# Patient Record
Sex: Male | Born: 1975 | Marital: Married | Smoking: Never smoker
Health system: Southern US, Community
[De-identification: ages and names within clinical notes are randomized; demographics above are authoritative.]

## PROBLEM LIST (undated history)

## (undated) DIAGNOSIS — K219 Gastro-esophageal reflux disease without esophagitis: Secondary | ICD-10-CM

## (undated) DIAGNOSIS — Z8719 Personal history of other diseases of the digestive system: Secondary | ICD-10-CM

## (undated) DIAGNOSIS — E785 Hyperlipidemia, unspecified: Secondary | ICD-10-CM

## (undated) DIAGNOSIS — T7840XA Allergy, unspecified, initial encounter: Secondary | ICD-10-CM

## (undated) DIAGNOSIS — F419 Anxiety disorder, unspecified: Secondary | ICD-10-CM

## (undated) HISTORY — DX: Anxiety disorder, unspecified: F41.9

## (undated) HISTORY — DX: Hyperlipidemia, unspecified: E78.5

## (undated) HISTORY — DX: Allergy, unspecified, initial encounter: T78.40XA

## (undated) HISTORY — PX: SEPTOPLASTY: SUR1290

---

## 2016-02-12 LAB — HM COLONOSCOPY

## 2016-08-19 HISTORY — PX: NASAL SEPTUM SURGERY: SHX37

## 2017-01-17 HISTORY — PX: ESOPHAGOGASTRODUODENOSCOPY: SHX1529

## 2017-01-17 HISTORY — PX: COLONOSCOPY: SHX174

## 2019-11-01 ENCOUNTER — Other Ambulatory Visit: Payer: Self-pay

## 2019-11-01 ENCOUNTER — Encounter: Payer: Self-pay | Admitting: Allergy and Immunology

## 2019-11-01 ENCOUNTER — Ambulatory Visit: Payer: BC Managed Care – PPO | Admitting: Allergy and Immunology

## 2019-11-01 VITALS — BP 118/80 | HR 60 | Temp 98.1°F | Resp 18 | Ht 73.0 in | Wt 196.1 lb

## 2019-11-01 DIAGNOSIS — H1013 Acute atopic conjunctivitis, bilateral: Secondary | ICD-10-CM

## 2019-11-01 DIAGNOSIS — H1010 Acute atopic conjunctivitis, unspecified eye: Secondary | ICD-10-CM | POA: Insufficient documentation

## 2019-11-01 DIAGNOSIS — J3089 Other allergic rhinitis: Secondary | ICD-10-CM | POA: Insufficient documentation

## 2019-11-01 MED ORDER — OLOPATADINE HCL 0.2 % OP SOLN: 1.0000 [drp] | Freq: Every day | OPHTHALMIC | 2 refills | Status: DC | PRN

## 2019-11-01 MED ORDER — LEVOCETIRIZINE DIHYDROCHLORIDE 5 MG PO TABS: 5.0000 mg | ORAL_TABLET | Freq: Every day | ORAL | 2 refills | Status: DC | PRN

## 2019-11-01 MED ORDER — XHANCE 93 MCG/ACT NA EXHU: 2.0000 | INHALANT_SUSPENSION | Freq: Two times a day (BID) | NASAL | 2 refills | Status: DC | PRN

## 2019-11-01 MED ORDER — EPINEPHRINE 0.3 MG/0.3ML IJ SOAJ: 0.3000 mg | Freq: Once | INTRAMUSCULAR | 1 refills | Status: AC

## 2019-11-01 NOTE — Assessment & Plan Note (Addendum)
   Aeroallergen avoidance measures have been discussed and provided in written form.  A prescription has been provided for levocetirizine(Xyzal), 5 mg daily as needed.  To avoid diminishing benefit with daily use (tachyphylaxis) of second generation antihistamine, consider alternating every few months between fexofenadine (Allegra) and levocetirizine (Xyzal).  A prescription has been provided for [facility], 2 actuations per nostril twice a day as needed. Proper technique has been discussed and demonstrated.  Nasal saline spray (i.e., Simply Saline) or nasal saline lavage (i.e., NeilMed) is recommended as needed and prior to medicated nasal sprays.  The risks and benefits of aeroallergen immunotherapy have been discussed. The patient is motivated to initiate immunotherapy if insurance coverage is favorable. He will let us know how he would like to proceed.

## 2019-11-01 NOTE — Progress Notes (Signed)
New Patient Note  RE: Cameron Joyce MRN: [id number] DOB: Feb 19, 1976 Date of Office Visit: 11/01/2019  Referring provider: No ref. provider found Primary care provider: Medicine, [facility]  Chief Complaint: Allergic Rhinitis  and Conjunctivitis   History of present illness: Cameron Joyce is a 44 y.o. male presenting today for evaluation of allergic rhinoconjunctivitis.  He experiences nasal congestion, rhinorrhea, sneezing, postnasal drainage, nasal pruritus, and ocular pruritus.  He started immunotherapy injections at [facility] otolaryngologist office approximately 16 years ago.  He took the injections for 15 years and then over the past year has been using sublingual drops.  He has noticed some improvement of his allergy symptoms, however still requires fexofenadine in the spring and summer for nasal/ocular symptoms.  He moved from the [address] to [address] in June 2020.  He has no history of symptoms consistent with asthma, eczema, or food allergies.  Assessment and plan: Seasonal and perennial allergic rhinitis  Aeroallergen avoidance measures have been discussed and provided in written form.  A prescription has been provided for levocetirizine(Xyzal), 5 mg daily as needed.  To avoid diminishing benefit with daily use (tachyphylaxis) of second generation antihistamine, consider alternating every few months between fexofenadine (Allegra) and levocetirizine (Xyzal).  A prescription has been provided for [facility], 2 actuations per nostril twice a day as needed. Proper technique has been discussed and demonstrated.  Nasal saline spray (i.e., Simply Saline) or nasal saline lavage (i.e., NeilMed) is recommended as needed and prior to medicated nasal sprays.  The risks and benefits of aeroallergen immunotherapy have been discussed. The patient is motivated to initiate immunotherapy if insurance coverage is favorable. He will let us know how he  would like to proceed.  Allergic conjunctivitis  Treatment plan as outlined above for allergic rhinitis.  A prescription has been provided for Pataday, one drop per eye daily as needed.  I have also recommended eye lubricant drops (i.e., Natural Tears) as needed.   Meds ordered this encounter  Medications  . levocetirizine (XYZAL) 5 MG tablet    Sig: Take 1 tablet (5 mg total) by mouth daily as needed for allergies.    Dispense:  30 tablet    Refill:  2  . Fluticasone Propionate (XHANCE) 93 MCG/ACT EXHU    Sig: Place 2 sprays into the nose 2 (two) times daily as needed.    Dispense:  16 mL    Refill:  2    Please call [phone] for delivery.  . Olopatadine HCl (PATADAY) 0.2 % SOLN    Sig: Place 1 drop into both eyes daily as needed.    Dispense:  2.5 mL    Refill:  2  . EPINEPHrine (AUVI-Q) 0.3 mg/0.3 mL IJ SOAJ injection    Sig: Inject 0.3 mLs (0.3 mg total) into the muscle once for 1 dose. As directed for life-threatening allergic reactions    Dispense:  2 each    Refill:  1    [phone]    Diagnostics: Epicutaneous testing: Robust reactivity to grass pollen, ragweed pollen, weed pollen, and tree pollen. Intradermal testing: Positive to molds, cat hair, and dust mite antigen.   Physical examination: Blood pressure 118/80, pulse 60, temperature 98.1 F (36.7 C), temperature source Temporal, resp. rate 18, height 6\' 1"  (1.854 m), weight 196 lb 1.6 oz (89 kg), SpO2 97 %.  General: Alert, interactive, in no acute distress. HEENT: TMs pearly gray, turbinates moderately edematous with clear discharge, post-pharynx erythematous. Neck: Supple  without lymphadenopathy. Lungs: Clear to auscultation without wheezing, rhonchi or rales. CV: Normal S1, S2 without murmurs. Abdomen: Nondistended, nontender. Skin: Warm and dry, without lesions or rashes. Extremities:  No clubbing, cyanosis or edema. Neuro:   Grossly intact.  Review of systems:  Review of systems negative  except as noted in HPI / PMHx or noted below: Review of Systems  Constitutional: Negative.   HENT: Negative.   Eyes: Negative.   Respiratory: Negative.   Cardiovascular: Negative.   Gastrointestinal: Negative.   Genitourinary: Negative.   Musculoskeletal: Negative.   Skin: Negative.   Neurological: Negative.   Endo/Heme/Allergies: Negative.   Psychiatric/Behavioral: Negative.     Past medical history:  Past Medical History:  Diagnosis Date  . Anxiety     Past surgical history:  Past Surgical History:  Procedure Laterality Date  . NASAL SEPTUM SURGERY  2018    Family history: Family History  Problem Relation Age of Onset  . Healthy Mother   . Diabetes Father   . Healthy Brother   . Arthritis Maternal Grandmother   . Lung cancer Paternal Grandmother   . Heart disease Paternal Grandfather   . Healthy Brother     Social history: Social History   Socioeconomic History  . Marital status: Married    Spouse name: Not on file  . Number of children: Not on file  . Years of education: Not on file  . Highest education level: Not on file  Occupational History  . Not on file  Tobacco Use  . Smoking status: Never Smoker  . Smokeless tobacco: Never Used  Substance and Sexual Activity  . Alcohol use: Yes    Comment: occasional   . Drug use: Never  . Sexual activity: Not on file  Other Topics Concern  . Not on file  Social History Narrative  . Not on file   Social Determinants of Health   Financial Resource Strain:   . Difficulty of Paying Living Expenses:   Food Insecurity:   . Worried About Programme researcher, broadcasting/film/video in the Last Year:   . Barista in the Last Year:   Transportation Needs:   . Freight forwarder (Medical):   Marland Kitchen Lack of Transportation (Non-Medical):   Physical Activity:   . Days of Exercise per Week:   . Minutes of Exercise per Session:   Stress:   . Feeling of Stress :   Social Connections:   . Frequency of Communication with Friends  and Family:   . Frequency of Social Gatherings with Friends and Family:   . Attends Religious Services:   . Active Member of Clubs or Organizations:   . Attends Banker Meetings:   Marland Kitchen Marital Status:   Intimate Partner Violence:   . Fear of Current or Ex-Partner:   . Emotionally Abused:   Marland Kitchen Physically Abused:   . Sexually Abused:     Environmental History: The patient lives in a 45 year old house with LVP throughout, gassy, and central air.  There is no known mold/water damage in the home.  There are no pets in the home.  He is a non-smoker.  Current Outpatient Medications  Medication Sig Dispense Refill  . atorvastatin (LIPITOR) 10 MG tablet Take 10 mg by mouth daily.    Marland Kitchen escitalopram (LEXAPRO) 20 MG tablet Take 20 mg by mouth daily.    . fexofenadine (ALLEGRA) 180 MG tablet Take 180 mg by mouth daily as needed for allergies or rhinitis.    Marland Kitchen  omeprazole (PRILOSEC) 20 MG capsule Take 20 mg by mouth every morning.    Marland Kitchen EPINEPHrine (AUVI-Q) 0.3 mg/0.3 mL IJ SOAJ injection Inject 0.3 mLs (0.3 mg total) into the muscle once for 1 dose. As directed for life-threatening allergic reactions 2 each 1  . Fluticasone Propionate (XHANCE) 93 MCG/ACT EXHU Place 2 sprays into the nose 2 (two) times daily as needed. 16 mL 2  . levocetirizine (XYZAL) 5 MG tablet Take 1 tablet (5 mg total) by mouth daily as needed for allergies. 30 tablet 2  . Olopatadine HCl (PATADAY) 0.2 % SOLN Place 1 drop into both eyes daily as needed. 2.5 mL 2   No current facility-administered medications for this visit.    Known medication allergies: No Known Allergies  I appreciate the opportunity to take part in Trevar's care. Please do not hesitate to contact me with questions.  Sincerely,   R. Edgar Frisk, MD

## 2019-11-01 NOTE — Patient Instructions (Addendum)
Seasonal and perennial allergic rhinitis  Aeroallergen avoidance measures have been discussed and provided in written form.  A prescription has been provided for levocetirizine(Xyzal), 5 mg daily as needed.  To avoid diminishing benefit with daily use (tachyphylaxis) of second generation antihistamine, consider alternating every few months between fexofenadine (Allegra) and levocetirizine (Xyzal).  A prescription has been provided for [facility], 2 actuations per nostril twice a day as needed. Proper technique has been discussed and demonstrated.  Nasal saline spray (i.e., Simply Saline) or nasal saline lavage (i.e., NeilMed) is recommended as needed and prior to medicated nasal sprays.  The risks and benefits of aeroallergen immunotherapy have been discussed. The patient is motivated to initiate immunotherapy if insurance coverage is favorable. He will let us know how he would like to proceed.  Allergic conjunctivitis  Treatment plan as outlined above for allergic rhinitis.  A prescription has been provided for Pataday, one drop per eye daily as needed.  I have also recommended eye lubricant drops (i.e., Natural Tears) as needed.   Return in about 3 months (around 02/01/2020), or if symptoms worsen or fail to improve.  Control of Dust Mite Allergen  House dust mites play a major role in allergic asthma and rhinitis.  They occur in environments with high humidity wherever human skin, the food for dust mites is found. High levels have been detected in dust obtained from mattresses, pillows, carpets, upholstered furniture, bed covers, clothes and soft toys.  The principal allergen of the house dust mite is found in its feces.  A gram of dust may contain 1,000 mites and 250,000 fecal particles.  Mite antigen is easily measured in the air during house cleaning activities.    1. Encase mattresses, including the box spring, and pillow, in an air tight cover.  Seal the zipper end of the encased  mattresses with wide adhesive tape. 2. Wash the bedding in water of 130 degrees Farenheit weekly.  Avoid cotton comforters/quilts and flannel bedding: the most ideal bed covering is the dacron comforter. 3. Remove all upholstered furniture from the bedroom. 4. Remove carpets, carpet padding, rugs, and non-washable window drapes from the bedroom.  Wash drapes weekly or use plastic window coverings. 5. Remove all non-washable stuffed toys from the bedroom.  Wash stuffed toys weekly. 6. Have the room cleaned frequently with a vacuum cleaner and a damp dust-mop.  The patient should not be in a room which is being cleaned and should wait 1 hour after cleaning before going into the room. 7. Close and seal all heating outlets in the bedroom.  Otherwise, the room will become filled with dust-laden air.  An electric heater can be used to heat the room. Reduce indoor humidity to less than 50%.  Do not use a humidifier.   Reducing Pollen Exposure  The American Academy of Allergy, Asthma and Immunology suggests the following steps to reduce your exposure to pollen during allergy seasons.    1. Do not hang sheets or clothing out to dry; pollen may collect on these items. 2. Do not mow lawns or spend time around freshly cut grass; mowing stirs up pollen. 3. Keep windows closed at night.  Keep car windows closed while driving. 4. Minimize morning activities outdoors, a time when pollen counts are usually at their highest. 5. Stay indoors as much as possible when pollen counts or humidity is high and on windy days when pollen tends to remain in the air longer. 6. Use air conditioning when possible.  Many air  conditioners have filters that trap the pollen spores. 7. Use a HEPA room air filter to remove pollen form the indoor air you breathe.   Control of Dog or Cat Allergen  Avoidance is the best way to manage a dog or cat allergy. If you have a dog or cat and are allergic to dog or cats, consider removing  the dog or cat from the home. If you have a dog or cat but don't want to find it a new home, or if your family wants a pet even though someone in the household is allergic, here are some strategies that may help keep symptoms at bay:  1. Keep the pet out of your bedroom and restrict it to only a few rooms. Be advised that keeping the dog or cat in only one room will not limit the allergens to that room. 2. Don't pet, hug or kiss the dog or cat; if you do, wash your hands with soap and water. 3. High-efficiency particulate air (HEPA) cleaners run continuously in a bedroom or living room can reduce allergen levels over time. 4. Place electrostatic material sheet in the air inlet vent in the bedroom. 5. Regular use of a high-efficiency vacuum cleaner or a central vacuum can reduce allergen levels. 6. Giving your dog or cat a bath at least once a week can reduce airborne allergen.   Control of Mold Allergen  Mold and fungi can grow on a variety of surfaces provided certain temperature and moisture conditions exist.  Outdoor molds grow on plants, decaying vegetation and soil.  The major outdoor mold, Alternaria and Cladosporium, are found in very high numbers during hot and dry conditions.  Generally, a late Summer - Fall peak is seen for common outdoor fungal spores.  Rain will temporarily lower outdoor mold spore count, but counts rise rapidly when the rainy period ends.  The most important indoor molds are Aspergillus and Penicillium.  Dark, humid and poorly ventilated basements are ideal sites for mold growth.  The next most common sites of mold growth are the bathroom and the kitchen.  Outdoor [employer] 1. Use air conditioning and keep windows closed 2. Avoid exposure to decaying vegetation. 3. Avoid leaf raking. 4. Avoid grain handling. 5. Consider wearing a face mask if working in moldy areas.  Indoor Mold Control 1. Maintain humidity below 50%. 2. Clean washable surfaces with 5% bleach  solution. 3. Remove sources e.g. Contaminated carpets.

## 2019-11-01 NOTE — Assessment & Plan Note (Signed)
   Treatment plan as outlined above for allergic rhinitis.  A prescription has been provided for Pataday, one drop per eye daily as needed.  I have also recommended eye lubricant drops (i.e., Natural Tears) as needed. 

## 2019-11-02 NOTE — Progress Notes (Signed)
VIALS EXP 11-01-20 

## 2019-11-03 DIAGNOSIS — J301 Allergic rhinitis due to pollen: Secondary | ICD-10-CM | POA: Diagnosis not present

## 2019-11-04 DIAGNOSIS — J3089 Other allergic rhinitis: Secondary | ICD-10-CM | POA: Diagnosis not present

## 2019-11-05 ENCOUNTER — Encounter: Payer: Self-pay | Admitting: Medical

## 2019-11-05 ENCOUNTER — Other Ambulatory Visit: Payer: Self-pay

## 2019-11-05 ENCOUNTER — Ambulatory Visit: Payer: BC Managed Care – PPO | Admitting: Medical

## 2019-11-05 VITALS — BP 132/80 | HR 61 | Temp 97.7°F | Ht 73.0 in | Wt 194.4 lb

## 2019-11-05 DIAGNOSIS — Z7185 Encounter for immunization safety counseling: Secondary | ICD-10-CM

## 2019-11-05 DIAGNOSIS — Z0000 Encounter for general adult medical examination without abnormal findings: Secondary | ICD-10-CM | POA: Diagnosis not present

## 2019-11-05 DIAGNOSIS — E785 Hyperlipidemia, unspecified: Secondary | ICD-10-CM | POA: Insufficient documentation

## 2019-11-05 DIAGNOSIS — Z7189 Other specified counseling: Secondary | ICD-10-CM

## 2019-11-05 DIAGNOSIS — J301 Allergic rhinitis due to pollen: Secondary | ICD-10-CM | POA: Diagnosis not present

## 2019-11-05 DIAGNOSIS — Z566 Other physical and mental strain related to work: Secondary | ICD-10-CM | POA: Insufficient documentation

## 2019-11-05 DIAGNOSIS — K219 Gastro-esophageal reflux disease without esophagitis: Secondary | ICD-10-CM | POA: Diagnosis not present

## 2019-11-05 DIAGNOSIS — K449 Diaphragmatic hernia without obstruction or gangrene: Secondary | ICD-10-CM | POA: Diagnosis not present

## 2019-11-05 MED ORDER — ESCITALOPRAM OXALATE 20 MG PO TABS: 20.0000 mg | ORAL_TABLET | Freq: Every day | ORAL | 0 refills | Status: DC

## 2019-11-05 MED ORDER — OMEPRAZOLE 20 MG PO CPDR: 20.0000 mg | DELAYED_RELEASE_CAPSULE | Freq: Every morning | ORAL | 3 refills | Status: DC

## 2019-11-05 MED ORDER — ALPRAZOLAM 0.5 MG PO TABS: 0.5000 mg | ORAL_TABLET | Freq: Every evening | ORAL | 0 refills | Status: DC | PRN

## 2019-11-05 NOTE — Progress Notes (Addendum)
Subjective:   HPI  Cameron Joyce is a 44 y.o. male who presents for Chief Complaint  Patient presents with  . New Patient (Initial Visit)    establish care   . Annual Exam    Patient Care Team: Jennet Scroggin, Cleda Mccreedy as PCP - General (Family Medicine) Sees dentist Sees eye doctor  Concerns: Referred by Lorayne Bender.  Was prior seeing PCP Scherry Ran in [place], [place].  Moved here in June.  Wife working on Ph D [employer]  Last Td 2011.    Lipids - On cholestenol medication 4 years.  Reportedly could be genetic related.  No other cardiac risk factors.    Put on Lexapro a few years ago for anger and outbursts related to stress.   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   Since moving to [place], much less stress at [employer] band program.  He now wonders if he should cut back to 10mg  daily.  Reviewed their medical, surgical, family, social, medication, and allergy history and updated chart as appropriate.  Past Medical History:  Diagnosis Date  . Allergy   . Anxiety   . Hyperlipidemia     Past Surgical History:  Procedure Laterality Date  . COLONOSCOPY  01/2017  . ESOPHAGOGASTRODUODENOSCOPY  01/2017   hiatal hernia  . NASAL SEPTUM SURGERY  2018  . SEPTOPLASTY     & turbinate reduction    Social History   Socioeconomic History  . Marital status: Married    Spouse name: Not on file  . Number of children: Not on file  . Years of education: Not on file  . Highest education level: Not on file  Occupational History  . Not on file  Tobacco Use  . Smoking status: Never Smoker  . Smokeless tobacco: Never Used  Substance and Sexual Activity  . Alcohol use: Yes    Comment: occasional   . Drug use: Never  . Sexual activity: Not on file  Other Topics Concern  . Not on file  Social History Narrative   Married, wife in PhD music program 2019, he teaches music and band at  [employer], exercise most days per week, [employer], has son in school of arts.   10/2019   Social Determinants of Health   Financial Resource Strain:   . Difficulty of Paying Living Expenses:   Food Insecurity:   . Worried About 11/2019 in the Last Year:   . Programme researcher, broadcasting/film/video in the Last Year:   Transportation Needs:   . Barista (Medical):   Freight forwarder Lack of Transportation (Non-Medical):   Physical Activity:   . Days of Exercise per Week:   . Minutes of Exercise per Session:   Stress:   . Feeling of Stress :   Social Connections:   . Frequency of Communication with Friends and Family:   . Frequency of Social Gatherings with Friends and Family:   . Attends Religious Services:   . Active Member of Clubs or Organizations:   . Attends Marland Kitchen Meetings:   Banker Marital Status:   Intimate Partner Violence:   . Fear of Current or Ex-Partner:   . Emotionally Abused:   Marland Kitchen Physically Abused:   . Sexually Abused:     Family History  Problem Relation Age of Onset  . Healthy Mother   . Diabetes Father   .  Healthy Brother   . Colon polyps Brother   . Arthritis Maternal Grandmother   . Lung cancer Paternal Grandmother   . Heart disease Paternal Grandfather   . Healthy Brother      Current Outpatient Medications:  .  atorvastatin (LIPITOR) 10 MG tablet, Take 10 mg by mouth daily., Disp: , Rfl:  .  escitalopram (LEXAPRO) 20 MG tablet, Take 1 tablet (20 mg total) by mouth daily., Disp: 90 tablet, Rfl: 0 .  Fluticasone Propionate (XHANCE) 93 MCG/ACT EXHU, Place 2 sprays into the nose 2 (two) times daily as needed., Disp: 16 mL, Rfl: 2 .  levocetirizine (XYZAL) 5 MG tablet, Take 1 tablet (5 mg total) by mouth daily as needed for allergies., Disp: 30 tablet, Rfl: 2 .  Olopatadine HCl (PATADAY) 0.2 % SOLN, Place 1 drop into both eyes daily as needed., Disp: 2.5 mL, Rfl: 2 .  omeprazole (PRILOSEC) 20 MG capsule, Take 1 capsule (20 mg total) by mouth  every morning., Disp: 90 capsule, Rfl: 3 .  ALPRAZolam (XANAX) 0.5 MG tablet, Take 1 tablet (0.5 mg total) by mouth at bedtime as needed for anxiety., Disp: 30 tablet, Rfl: 0 .  fexofenadine (ALLEGRA) 180 MG tablet, Take 180 mg by mouth daily as needed for allergies or rhinitis., Disp: , Rfl:   No Known Allergies   Review of Systems Constitutional: -fever, -chills, -sweats, -unexpected weight change, -decreased appetite, -fatigue Allergy: -sneezing, -itching, -congestion Dermatology: -changing moles, --rash, -lumps ENT: -runny nose, -ear pain, -sore throat, -hoarseness, -sinus pain, -teeth pain, - ringing in ears, -hearing loss, -nosebleeds Cardiology: -chest pain, -palpitations, -swelling, -difficulty breathing when lying flat, -waking up short of breath Respiratory: -cough, -shortness of breath, -difficulty breathing with exercise or exertion, -wheezing, -coughing up blood Gastroenterology: -abdominal pain, -nausea, -vomiting, -diarrhea, -constipation, -blood in stool, -changes in bowel movement, -difficulty swallowing or eating Hematology: -bleeding, -bruising  Musculoskeletal: -joint aches, -muscle aches, -joint swelling, -back pain, -neck pain, -cramping, -changes in gait Ophthalmology: denies vision changes, eye redness, itching, discharge Urology: -burning with urination, -difficulty urinating, -blood in urine, -urinary frequency, -urgency, -incontinence Neurology: -headache, -weakness, -tingling, -numbness, -memory loss, -falls, -dizziness Psychology: -depressed mood, -agitation, -sleep problems Male GU: no testicular mass, pain, no lymph nodes swollen, no swelling, no rash.     Objective:  BP 132/80   Pulse 61   Temp 97.7 F (36.5 C)   Ht 6\' 1"  (1.854 m)   Wt 194 lb 6.4 oz (88.2 kg)   SpO2 98%   BMI 25.65 kg/m   General appearance: alert, no distress, WD/WN, Caucasian male Skin: few scattered lesions, left upper back with 2 elliptical scars from prior biopsy, right  superior parietal scalp with slightly raised 63mm diameter nonspecific lesion Neck: supple, no lymphadenopathy, no thyromegaly, no masses, normal ROM, no bruits Chest: non tender, normal shape and expansion Heart: RRR, normal S1, S2, no murmurs Lungs: CTA bilaterally, no wheezes, rhonchi, or rales Abdomen: +bs, soft, non tender, non distended, no masses, no hepatomegaly, no splenomegaly, no bruits Back: non tender, normal ROM, no scoliosis Musculoskeletal: upper extremities non tender, no obvious deformity, normal ROM throughout, lower extremities non tender, no obvious deformity, normal ROM throughout Extremities: no edema, no cyanosis, no clubbing Pulses: 2+ symmetric, upper and lower extremities, normal cap refill Neurological: alert, oriented x 3, CN2-12 intact, strength normal upper extremities and lower extremities, sensation normal throughout, DTRs 2+ throughout, no cerebellar signs, gait normal Psychiatric: normal affect, behavior normal, pleasant  GU: normal male external genitalia,circumcised, nontender,  no masses, no hernia, no lymphadenopathy Rectal: deferred   Assessment and Plan :   Encounter Diagnoses  Name Primary?  . Encounter for health maintenance examination in adult Yes  . Gastroesophageal reflux disease, unspecified whether esophagitis present   . Hiatal hernia   . Allergic rhinitis due to pollen, unspecified seasonality   . Hyperlipidemia, unspecified hyperlipidemia type   . Work stress   . Vaccine counseling     Physical exam - discussed and counseled on healthy lifestyle, diet, exercise, preventative care, vaccinations, sick and well care, proper use of emergency dept and after hours care, and addressed their concerns.    Health screening: See your eye doctor yearly for routine vision care. See your dentist yearly for routine dental care including hygiene visits twice yearly.  Cancer screening Advised monthly self testicular exam  Colonoscopy:  Advised  repeat closer to 45yo  PSA age 50yo   Vaccinations: Advised yearly influenza vaccine Get second covid vaccine tomorrow as planned then plan to return for Td in 1 month.   Separate significant chronic issues discussed: Work stress, prior mood changes - he will either continue Lexapro 20mg  or cut down to 10mg  daily and follow up in a few weeks  Hyperlipidemia, recent elevated LFT - lab today but consider reassessment for need    Leyland was seen today for new patient (initial visit) and annual exam.  Diagnoses and all orders for this visit:  Encounter for health maintenance examination in adult -     Comprehensive metabolic panel -     CBC with Differential/Platelet -     Lipid panel  Gastroesophageal reflux disease, unspecified whether esophagitis present  Hiatal hernia  Allergic rhinitis due to pollen, unspecified seasonality  Hyperlipidemia, unspecified hyperlipidemia type -     Lipid panel  Work stress  Vaccine counseling  Other orders -     escitalopram (LEXAPRO) 20 MG tablet; Take 1 tablet (20 mg total) by mouth daily. -     omeprazole (PRILOSEC) 20 MG capsule; Take 1 capsule (20 mg total) by mouth every morning. -     ALPRAZolam (XANAX) 0.5 MG tablet; Take 1 tablet (0.5 mg total) by mouth at bedtime as needed for anxiety.    Follow-up pending labs, yearly for physical

## 2019-11-05 NOTE — Patient Instructions (Signed)
Preventative Care for Adults - Male    Thank you for coming in for your well visit today, and thank you for trusting [place] with your care!   Maintain regular health and wellness exams:  A routine yearly physical is a good way to check in with your primary care provider about your health and preventive screening. It is also an opportunity to share updates about your health and any concerns you have, and receive a thorough all-over exam.   Most health insurance companies pay for at least some preventative services.  Check with your health plan for specific coverages.  What preventative services do men need?  Adult men should have their weight and blood pressure checked regularly.   Men age 45 and older should have their cholesterol levels checked regularly.  Beginning at age 45 and continuing to age 45, men should be screened for colorectal cancer.  Certain people may need continued testing until age 45.  Updating vaccinations is part of preventative care.  Vaccinations help protect against diseases such as the flu.  Osteoporosis is a disease in which the bones lose minerals and strength as we age. Men ages 45 and over should discuss this with their caregivers  Lab tests are generally done as part of preventative care to screen for anemia and blood disorders, to screen for problems with the kidneys and liver, to screen for bladder problems, to check blood sugar, and to check your cholesterol level.  Preventative services generally include counseling about diet, exercise, avoiding tobacco, drugs, excessive alcohol consumption, and sexually transmitted infections.   Xrays and CT scans are not normally done as a preventative test, and most insurances do not pay for imaging for screening other than as discussed under cancer screens below.   On the other hand, if you have certain medical concerns, imaging may be necessary as a diagnostic test.    Your Medical Team Your medical team starts with  [place], your PCP or primary care provider.  Please use our services for your routine care such as physicals, screenings, immunizations, sick visits, and your first stop for general medical concerns.  You can call our number for after hours information for urgent questions that may need attention but cannot wait til the next business day.    Urgent care-urgent cares exist to provide care when your primary care office would typically be closed such as evenings or weekends.   Urgent care is for evaluation of urgent medical problems that do not necessarily require emergency department care, but cannot wait til the next business day when we are open.  Emergency department care-please reserve emergency department care for serious, urgent, possibly life-threatening medical problems.  This includes issues like possible stroke, heart attack, significant injury, mental health crisis, or other urgent need that requires immediate medical attention.     See your dentist office twice yearly for hygiene and cleaning visits.   Brush your teeth and floss your teeth daily.  See your eye doctor yearly for routine eye exam and screenings for glaucoma and retinal disease.     Vaccines:  Stay up to date with your tetanus shots and other required immunizations. You should have a booster for tetanus every 10 years. Be sure to get your flu shot every year, since 5%-20% of the [place] population comes down with the flu. The flu vaccine changes each year, so being vaccinated once is not enough. Get your shot in the fall, before the flu season peaks.   Other vaccines  to consider:  Pneumococcal vaccine to protect against certain types of pneumonia.  This is normally recommended for adults age 45 or older.  However, adults younger than 45 years old with certain underlying conditions such as diabetes, heart or lung disease should also receive the vaccine.  Shingles vaccine to protect against Varicella Zoster if you are older than age  84, or younger than 45 years old with certain underlying illness.  If you have not had the Shingrix vaccine, please call your insurer to inquire about coverage for the Shingrix vaccine given in 2 doses.   Some insurers cover this vaccine after age 27, some cover this after age 45.  If your insurer covers this, then call to schedule appointment to have this vaccine here  Hepatitis A vaccine to protect against a form of infection of the liver by a virus acquired from food.  Hepatitis B vaccine to protect against a form of infection of the liver by a virus acquired from blood or body fluids, particularly if you work in health care.  If you plan to travel internationally, check with your local health department for specific vaccination recommendations.  Human Papilloma Virus or HPV causes cancer of the cervix, and other infections that can be transmitted from person to person. There is a vaccine for HPV, and males should get immunized between the ages of 45 and 45. It requires a series of 3 shots.   Covid/Coronavirus - as the vaccines are becoming available, please consider vaccination if you are a health care worker, first responder, or have significant health problems such as asthma, COPD, heart disease, hypertension, diabetes, obesity, multiple medical problems, over age 45yo, or immunocompromised.      What should I know about Cancer screening? Many types of cancers can be detected early and may often be prevented. Lung Cancer  You should be screened every year for lung cancer if: ? You are a current smoker who has smoked for at least 30 years. ? You are a former smoker who has quit within the past 15 years.  Talk to your health care provider about your screening options, when you should start screening, and how often you should be screened.  Colorectal Cancer  Routine colorectal cancer screening usually begins at 45 years of age and should be repeated every 5-10 years until you are 45  years old. You may need to be screened more often if early forms of precancerous polyps or small growths are found. Your health care provider may recommend screening at an earlier age if you have risk factors for colon cancer.  Your health care provider may recommend using home test kits to check for hidden blood in the stool.  A small camera at the end of a tube can be used to examine your colon (sigmoidoscopy or colonoscopy). This checks for the earliest forms of colorectal cancer.  Prostate and Testicular Cancer  Depending on your age and overall health, your health care provider may do certain tests to screen for prostate and testicular cancer.  Talk to your health care provider about any symptoms or concerns you have about testicular or prostate cancer.  Skin Cancer  Check your skin from head to toe regularly.  Tell your health care provider about any new moles or changes in moles, especially if: ? There is a change in a mole's size, shape, or color. ? You have a mole that is larger than a pencil eraser.  Always use sunscreen. Apply sunscreen liberally and repeat  throughout the day.  Protect yourself by wearing long sleeves, pants, a wide-brimmed hat, and sunglasses when outside.    GENERAL RECOMMENDATIONS FOR GOOD HEALTH:  Healthy diet:  Eat a variety of foods, including fruit, vegetables, animal or vegetable protein, such as meat, fish, chicken, and eggs, or beans, lentils, tofu, and grains, such as rice.  Drink plenty of water daily.  Decrease saturated fat in the diet, avoid lots of red meat, processed foods, sweets, fast foods, and fried foods.  Exercise:  Aerobic exercise helps maintain good heart health. At least 30-40 minutes of moderate-intensity exercise is recommended. For example, a brisk walk that increases your heart rate and breathing. This should be done on most days of the week.   Find a type of exercise or a variety of exercises that you enjoy so that it  becomes a part of your daily life.  Examples are running, walking, swimming, water aerobics, and biking.  For motivation and support, explore group exercise such as aerobic class, spin class, Zumba, Yoga,or  martial arts, etc.    Set exercise goals for yourself, such as a certain weight goal, walk or run in a race such as a 5k walk/run.  Speak to your primary care provider about exercise goals.  Your weight readings per our records: Wt Readings from Last 3 Encounters:  11/05/19 194 lb 6.4 oz (88.2 kg)  11/01/19 196 lb 1.6 oz (89 kg)    Body mass index is 25.65 kg/m.    Disease prevention:  If you smoke or chew tobacco, find out from your caregiver how to quit. It can literally save your life, no matter how long you have been a tobacco user. If you do not use tobacco, never begin.   Maintain a healthy diet and normal weight. Increased weight leads to problems with blood pressure and diabetes.   The Body Mass Index or BMI is a way of measuring how much of your body is fat. Having a BMI above 27 increases the risk of heart disease, diabetes, hypertension, stroke and other problems related to obesity. Your caregiver can help determine your BMI and based on it develop an exercise and dietary program to help you achieve or maintain this important measurement at a healthful level.  High blood pressure causes heart and blood vessel problems.  Persistent high blood pressure should be treated with medicine if weight loss and exercise do not work.  Your blood pressure readings per our records:     BP Readings from Last 3 Encounters:  11/05/19 132/80  11/01/19 118/80     Fat and cholesterol leaves deposits in your arteries that can block them. This causes heart disease and vessel disease elsewhere in your body.  If your cholesterol is found to be high, or if you have heart disease or certain other medical conditions, then you may need to have your cholesterol monitored frequently and be treated  with medication.   Ask if you should have a cardiac stress test if your history suggests this. A stress test is a test done on a treadmill that looks for heart disease. This test can find disease prior to there being a problem.   Osteoporosis is a disease in which the bones lose minerals and strength as we age. This can result in serious bone fractures. Risk of osteoporosis can be identified using a bone density scan. Men ages 12 and over should discuss this with their caregivers. Ask your caregiver whether you should be taking a calcium  supplement and Vitamin D, to reduce the rate of osteoporosis.   Avoid drinking alcohol in excess (more than two drinks per day).  Avoid use of street drugs. Do not share needles with anyone. Ask for professional help if you need assistance or instructions on stopping the use of alcohol, cigarettes, and/or drugs.  Brush your teeth twice a day with fluoride toothpaste, and floss once a day. Good oral hygiene prevents tooth decay and gum disease. The problems can be painful, unattractive, and can cause other health problems. Visit your dentist for a routine oral and dental check up and preventive care every 6-12 months.   Safety:  Use seatbelts 100% of the time, whether driving or as a passenger.  Use safety devices such as hearing protection if you work in environments with loud noise or significant background noise.  Use safety glasses when doing any work that could send debris in to the eyes.  Use a helmet if you ride a bike or motorcycle.  Use appropriate safety gear for contact sports.  Talk to your caregiver about gun safety.  Use sunscreen with a SPF (or skin protection factor) of 15 or greater.  Lighter skinned people are at a greater risk of skin cancer. Don't forget to also wear sunglasses in order to protect your eyes from too much damaging sunlight. Damaging sunlight can accelerate cataract formation.   Keep carbon monoxide and smoke detectors in your home  functioning at all times. Change the batteries every 6 months or use a model that plugs into the wall.    Sexual activity: . Sex is a normal part of life and sexual activity can continue into older adulthood for many healthy people.   . If you are having erectile dysfunction issues, please follow up to discuss this further.   . If you are not in a monogamous relationship or have more than one partner, please practice safe sex.  Use condoms. Condoms are used for birth control and to help reduce the spread of sexually transmitted infections (or STIs).  Some of the STIs are gonorrhea (the clap), chlamydia, syphilis, trichomonas, herpes, HPV (human papilloma virus) and HIV (human immunodeficiency virus) which causes AIDS. The herpes, HIV and HPV are viral illnesses that have no cure. These can result in disability, cancer and death.   We are able to test for STIs here at our office.

## 2019-11-06 LAB — COMPREHENSIVE METABOLIC PANEL
ALT: 31 IU/L (ref 0–44)
AST: 39 IU/L (ref 0–40)
Albumin/Globulin Ratio: 1.7 (ref 1.2–2.2)
Albumin: 4.5 g/dL (ref 4.0–5.0)
Alkaline Phosphatase: 102 IU/L (ref 39–117)
BUN/Creatinine Ratio: 21 — ABNORMAL HIGH (ref 9–20)
BUN: 20 mg/dL (ref 6–24)
Bilirubin Total: 0.7 mg/dL (ref 0.0–1.2)
CO2: 24 mmol/L (ref 20–29)
Calcium: 9.7 mg/dL (ref 8.7–10.2)
Chloride: 99 mmol/L (ref 96–106)
Creatinine, Ser: 0.97 mg/dL (ref 0.76–1.27)
GFR calc Af Amer: 109 mL/min/{1.73_m2} (ref >59)
GFR calc non Af Amer: 95 mL/min/{1.73_m2} (ref >59)
Globulin, Total: 2.6 g/dL (ref 1.5–4.5)
Glucose: 87 mg/dL (ref 65–99)
Potassium: 4.3 mmol/L (ref 3.5–5.2)
Sodium: 139 mmol/L (ref 134–144)
Total Protein: 7.1 g/dL (ref 6.0–8.5)

## 2019-11-06 LAB — CBC WITH DIFFERENTIAL/PLATELET
Basophils Absolute: 0 10*3/uL (ref 0.0–0.2)
Basos: 0 %
EOS (ABSOLUTE): 0.1 10*3/uL (ref 0.0–0.4)
Eos: 2 %
Hematocrit: 45.1 % (ref 37.5–51.0)
Hemoglobin: 16 g/dL (ref 13.0–17.7)
Immature Grans (Abs): 0 10*3/uL (ref 0.0–0.1)
Immature Granulocytes: 0 %
Lymphocytes Absolute: 2.5 10*3/uL (ref 0.7–3.1)
Lymphs: 38 %
MCH: 32.3 pg (ref 26.6–33.0)
MCHC: 35.5 g/dL (ref 31.5–35.7)
MCV: 91 fL (ref 79–97)
Monocytes Absolute: 0.5 10*3/uL (ref 0.1–0.9)
Monocytes: 8 %
Neutrophils Absolute: 3.4 10*3/uL (ref 1.4–7.0)
Neutrophils: 52 %
Platelets: 244 10*3/uL (ref 150–450)
RBC: 4.96 x10E6/uL (ref 4.14–5.80)
RDW: 12.7 % (ref 11.6–15.4)
WBC: 6.5 10*3/uL (ref 3.4–10.8)

## 2019-11-06 LAB — LIPID PANEL
Chol/HDL Ratio: 2.8 ratio (ref 0.0–5.0)
Cholesterol, Total: 177 mg/dL (ref 100–199)
HDL: 64 mg/dL (ref >39)
LDL Chol Calc (NIH): 98 mg/dL (ref 0–99)
Triglycerides: 80 mg/dL (ref 0–149)
VLDL Cholesterol Cal: 15 mg/dL (ref 5–40)

## 2019-11-24 ENCOUNTER — Ambulatory Visit (INDEPENDENT_AMBULATORY_CARE_PROVIDER_SITE_OTHER): Payer: BC Managed Care – PPO

## 2019-11-24 ENCOUNTER — Other Ambulatory Visit: Payer: Self-pay

## 2019-11-24 DIAGNOSIS — J309 Allergic rhinitis, unspecified: Secondary | ICD-10-CM

## 2019-11-24 NOTE — Progress Notes (Signed)
Immunotherapy   Patient Details  Name: Deakin Lacek MRN: [id number] Date of Birth: [birth year]  11/24/2019  Teressa Senter started allergy injections today. Patient received 0.05 out of both his blue vials with an expiration date of 11/01/2020. One with G-Weeds-T and the other with M-DM-C. Patient waited 30 minutes in an exam room with no problems. Following schedule: A Frequency: - Epi-Pen: times weekly with 72 hours in between. Consent signed and patient instructions given.   Dub Mikes 11/24/2019, 3:09 PM

## 2019-11-29 ENCOUNTER — Ambulatory Visit (INDEPENDENT_AMBULATORY_CARE_PROVIDER_SITE_OTHER): Payer: BC Managed Care – PPO

## 2019-11-29 DIAGNOSIS — J309 Allergic rhinitis, unspecified: Secondary | ICD-10-CM

## 2019-12-02 ENCOUNTER — Ambulatory Visit (INDEPENDENT_AMBULATORY_CARE_PROVIDER_SITE_OTHER): Payer: BC Managed Care – PPO

## 2019-12-02 DIAGNOSIS — J309 Allergic rhinitis, unspecified: Secondary | ICD-10-CM | POA: Diagnosis not present

## 2019-12-06 ENCOUNTER — Telehealth: Payer: Self-pay | Admitting: Medical

## 2019-12-06 NOTE — Telephone Encounter (Signed)
Please see his email message.  I believe he had wanted to stop his cholesterol medicine and give it a few months and then recheck labs after being off his medication.  He has him an email saying he forgot to stop the cholesterol medicine so we can likely defer his appointment.

## 2019-12-07 ENCOUNTER — Ambulatory Visit (INDEPENDENT_AMBULATORY_CARE_PROVIDER_SITE_OTHER): Payer: BC Managed Care – PPO

## 2019-12-07 DIAGNOSIS — J309 Allergic rhinitis, unspecified: Secondary | ICD-10-CM

## 2019-12-14 ENCOUNTER — Ambulatory Visit (INDEPENDENT_AMBULATORY_CARE_PROVIDER_SITE_OTHER): Payer: BC Managed Care – PPO

## 2019-12-14 DIAGNOSIS — J309 Allergic rhinitis, unspecified: Secondary | ICD-10-CM | POA: Diagnosis not present

## 2019-12-16 NOTE — Telephone Encounter (Signed)
Has this been handled?  It is still showing up in my in basket

## 2019-12-20 ENCOUNTER — Ambulatory Visit (INDEPENDENT_AMBULATORY_CARE_PROVIDER_SITE_OTHER): Payer: BC Managed Care – PPO

## 2019-12-20 ENCOUNTER — Other Ambulatory Visit: Payer: Self-pay | Admitting: *Deleted

## 2019-12-20 DIAGNOSIS — J309 Allergic rhinitis, unspecified: Secondary | ICD-10-CM | POA: Diagnosis not present

## 2019-12-20 MED ORDER — XHANCE 93 MCG/ACT NA EXHU: 2.0000 | INHALANT_SUSPENSION | Freq: Two times a day (BID) | NASAL | 5 refills | Status: DC | PRN

## 2019-12-20 NOTE — Telephone Encounter (Signed)
Looks like pt has already been in for a visit

## 2019-12-27 ENCOUNTER — Ambulatory Visit (INDEPENDENT_AMBULATORY_CARE_PROVIDER_SITE_OTHER): Payer: BC Managed Care – PPO

## 2019-12-27 DIAGNOSIS — J309 Allergic rhinitis, unspecified: Secondary | ICD-10-CM | POA: Diagnosis not present

## 2020-01-04 ENCOUNTER — Ambulatory Visit (INDEPENDENT_AMBULATORY_CARE_PROVIDER_SITE_OTHER): Payer: BC Managed Care – PPO

## 2020-01-04 DIAGNOSIS — J309 Allergic rhinitis, unspecified: Secondary | ICD-10-CM

## 2020-01-07 ENCOUNTER — Ambulatory Visit (INDEPENDENT_AMBULATORY_CARE_PROVIDER_SITE_OTHER): Payer: BC Managed Care – PPO | Admitting: *Deleted

## 2020-01-07 DIAGNOSIS — J309 Allergic rhinitis, unspecified: Secondary | ICD-10-CM

## 2020-01-10 ENCOUNTER — Ambulatory Visit (INDEPENDENT_AMBULATORY_CARE_PROVIDER_SITE_OTHER): Payer: BC Managed Care – PPO

## 2020-01-10 DIAGNOSIS — J309 Allergic rhinitis, unspecified: Secondary | ICD-10-CM | POA: Diagnosis not present

## 2020-01-13 ENCOUNTER — Ambulatory Visit (INDEPENDENT_AMBULATORY_CARE_PROVIDER_SITE_OTHER): Payer: BC Managed Care – PPO

## 2020-01-13 DIAGNOSIS — J309 Allergic rhinitis, unspecified: Secondary | ICD-10-CM

## 2020-01-18 ENCOUNTER — Ambulatory Visit (INDEPENDENT_AMBULATORY_CARE_PROVIDER_SITE_OTHER): Payer: BC Managed Care – PPO

## 2020-01-18 DIAGNOSIS — J309 Allergic rhinitis, unspecified: Secondary | ICD-10-CM

## 2020-01-21 ENCOUNTER — Ambulatory Visit (INDEPENDENT_AMBULATORY_CARE_PROVIDER_SITE_OTHER): Payer: BC Managed Care – PPO

## 2020-01-21 DIAGNOSIS — J309 Allergic rhinitis, unspecified: Secondary | ICD-10-CM | POA: Diagnosis not present

## 2020-01-23 ENCOUNTER — Other Ambulatory Visit: Payer: Self-pay | Admitting: Allergy and Immunology

## 2020-02-01 ENCOUNTER — Ambulatory Visit (INDEPENDENT_AMBULATORY_CARE_PROVIDER_SITE_OTHER): Payer: BC Managed Care – PPO

## 2020-02-01 DIAGNOSIS — J309 Allergic rhinitis, unspecified: Secondary | ICD-10-CM | POA: Diagnosis not present

## 2020-02-03 ENCOUNTER — Ambulatory Visit: Payer: BC Managed Care – PPO | Admitting: Allergy and Immunology

## 2020-02-03 ENCOUNTER — Encounter: Payer: Self-pay | Admitting: Allergy and Immunology

## 2020-02-03 DIAGNOSIS — J3089 Other allergic rhinitis: Secondary | ICD-10-CM | POA: Diagnosis not present

## 2020-02-03 DIAGNOSIS — H1013 Acute atopic conjunctivitis, bilateral: Secondary | ICD-10-CM

## 2020-02-03 NOTE — Progress Notes (Signed)
    Follow-up Note  RE: Cameron Joyce MRN: [id number] DOB: September 05, 1975 Date of Office Visit: 02/03/2020  Primary care provider: Jac Canavan, PA-C Referring provider: Medicine, Gracy Bruins*  History of present illness: Cameron Joyce is a 44 y.o. male with allergic rhinoconjunctivitis on immunotherapy presenting today for follow-up.  He was previously seen in this clinic for his initial evaluation on November 01, 2019.  He has been receiving immunotherapy buildup injections without problems or complications.  He has been controlling his nasal and ocular allergy symptoms with Xhance as needed, levocetirizine as needed, and olopatadine eyedrops as needed.  He has no new problems or complaints today.  Assessment and plan: Seasonal and perennial allergic rhinitis  Continue appropriate allergen avoidance measures and immunotherapy injections per protocol.  Continue Xhance nasal spray, 2 sprays per nostril twice daily as needed.  Nasal saline spray (i.e., Simply Saline) or nasal saline lavage (i.e., NeilMed) is recommended as needed and prior to medicated nasal sprays.  Consider levocetirizine (Xyzal) 5 mg daily as needed.  To avoid diminishing benefit with daily use (tachyphylaxis) of second generation antihistamine, consider alternating every few months between fexofenadine (Allegra) and levocetirizine (Xyzal).  Medications will be decreased or discontinued as symptom relief from immunotherapy becomes evident.  Allergic conjunctivitis  Treatment plan as outlined above.  Continue Pataday, 1 drop per eye daily as needed.  Physical examination: Blood pressure 122/76, pulse (!) 54, temperature 97.8 F (36.6 C), temperature source Oral, resp. rate 14, SpO2 100 %.  General: Alert, interactive, in no acute distress. HEENT: TMs pearly gray, turbinates mildly edematous without discharge, post-pharynx unremarkable. Neck: Supple without lymphadenopathy. Lungs: Clear to auscultation  without wheezing, rhonchi or rales. CV: Normal S1, S2 without murmurs. Skin: Warm and dry, without lesions or rashes.  The following portions of the patient's history were reviewed and updated as appropriate: allergies, current medications, past family history, past medical history, past social history, past surgical history and problem list.   Current Outpatient Medications  Medication Sig Dispense Refill  . ALPRAZolam (XANAX) 0.5 MG tablet Take 1 tablet (0.5 mg total) by mouth at bedtime as needed for anxiety. 30 tablet 0  . escitalopram (LEXAPRO) 20 MG tablet Take 1 tablet (20 mg total) by mouth daily. 90 tablet 0  . Fluticasone Propionate (XHANCE) 93 MCG/ACT EXHU Place 2 sprays into the nose 2 (two) times daily as needed. 16 mL 5  . levocetirizine (XYZAL) 5 MG tablet TAKE 1 TABLET (5 MG TOTAL) BY MOUTH DAILY AS NEEDED FOR ALLERGIES. 90 tablet 0  . Olopatadine HCl (PATADAY) 0.2 % SOLN Place 1 drop into both eyes daily as needed. 2.5 mL 2  . omeprazole (PRILOSEC) 20 MG capsule Take 1 capsule (20 mg total) by mouth every morning. 90 capsule 3  . atorvastatin (LIPITOR) 10 MG tablet Take 10 mg by mouth daily. (Patient not taking: Reported on 02/03/2020)     No current facility-administered medications for this visit.    No Known Allergies  I appreciate the opportunity to take part in Ki's care. Please do not hesitate to contact me with questions.  Sincerely,   R. Jorene Guest, MD

## 2020-02-03 NOTE — Patient Instructions (Addendum)
Seasonal and perennial allergic rhinitis  Continue appropriate allergen avoidance measures and immunotherapy injections per protocol.  Continue Xhance nasal spray, 2 sprays per nostril twice daily as needed.  Nasal saline spray (i.e., Simply Saline) or nasal saline lavage (i.e., NeilMed) is recommended as needed and prior to medicated nasal sprays.  Consider levocetirizine (Xyzal) 5 mg daily as needed.  To avoid diminishing benefit with daily use (tachyphylaxis) of second generation antihistamine, consider alternating every few months between fexofenadine (Allegra) and levocetirizine (Xyzal).  Medications will be decreased or discontinued as symptom relief from immunotherapy becomes evident.  Allergic conjunctivitis  Treatment plan as outlined above.  Continue Pataday, 1 drop per eye daily as needed.   Return in about 1 year (around 02/02/2021), or if symptoms worsen or fail to improve.

## 2020-02-03 NOTE — Assessment & Plan Note (Signed)
   Treatment plan as outlined above.  Continue Pataday, 1 drop per eye daily as needed.

## 2020-02-03 NOTE — Assessment & Plan Note (Addendum)
   Continue appropriate allergen avoidance measures and immunotherapy injections per protocol.  Continue Xhance nasal spray, 2 sprays per nostril twice daily as needed.  Nasal saline spray (i.e., Simply Saline) or nasal saline lavage (i.e., NeilMed) is recommended as needed and prior to medicated nasal sprays.  Consider levocetirizine (Xyzal) 5 mg daily as needed.  To avoid diminishing benefit with daily use (tachyphylaxis) of second generation antihistamine, consider alternating every few months between fexofenadine (Allegra) and levocetirizine (Xyzal).  Medications will be decreased or discontinued as symptom relief from immunotherapy becomes evident.

## 2020-02-04 ENCOUNTER — Other Ambulatory Visit: Payer: Self-pay | Admitting: Medical

## 2020-02-04 ENCOUNTER — Ambulatory Visit (INDEPENDENT_AMBULATORY_CARE_PROVIDER_SITE_OTHER): Payer: BC Managed Care – PPO

## 2020-02-04 DIAGNOSIS — J309 Allergic rhinitis, unspecified: Secondary | ICD-10-CM | POA: Diagnosis not present

## 2020-02-10 ENCOUNTER — Ambulatory Visit (INDEPENDENT_AMBULATORY_CARE_PROVIDER_SITE_OTHER): Payer: BC Managed Care – PPO

## 2020-02-10 DIAGNOSIS — J309 Allergic rhinitis, unspecified: Secondary | ICD-10-CM

## 2020-02-14 ENCOUNTER — Ambulatory Visit (INDEPENDENT_AMBULATORY_CARE_PROVIDER_SITE_OTHER): Payer: BC Managed Care – PPO

## 2020-02-14 DIAGNOSIS — J309 Allergic rhinitis, unspecified: Secondary | ICD-10-CM | POA: Diagnosis not present

## 2020-02-22 ENCOUNTER — Ambulatory Visit (INDEPENDENT_AMBULATORY_CARE_PROVIDER_SITE_OTHER): Payer: BC Managed Care – PPO

## 2020-02-22 DIAGNOSIS — J309 Allergic rhinitis, unspecified: Secondary | ICD-10-CM

## 2020-02-28 ENCOUNTER — Ambulatory Visit (INDEPENDENT_AMBULATORY_CARE_PROVIDER_SITE_OTHER): Payer: BC Managed Care – PPO

## 2020-02-28 DIAGNOSIS — J309 Allergic rhinitis, unspecified: Secondary | ICD-10-CM

## 2020-03-07 ENCOUNTER — Ambulatory Visit (INDEPENDENT_AMBULATORY_CARE_PROVIDER_SITE_OTHER): Payer: BC Managed Care – PPO

## 2020-03-07 DIAGNOSIS — J309 Allergic rhinitis, unspecified: Secondary | ICD-10-CM

## 2020-03-08 ENCOUNTER — Other Ambulatory Visit: Payer: Self-pay

## 2020-03-08 MED ORDER — XHANCE 93 MCG/ACT NA EXHU: 2.0000 | INHALANT_SUSPENSION | Freq: Two times a day (BID) | NASAL | 5 refills | Status: DC | PRN

## 2020-03-13 ENCOUNTER — Ambulatory Visit (INDEPENDENT_AMBULATORY_CARE_PROVIDER_SITE_OTHER): Payer: BC Managed Care – PPO

## 2020-03-13 DIAGNOSIS — J309 Allergic rhinitis, unspecified: Secondary | ICD-10-CM

## 2020-03-21 ENCOUNTER — Ambulatory Visit (INDEPENDENT_AMBULATORY_CARE_PROVIDER_SITE_OTHER): Payer: BC Managed Care – PPO

## 2020-03-21 DIAGNOSIS — J309 Allergic rhinitis, unspecified: Secondary | ICD-10-CM | POA: Diagnosis not present

## 2020-03-29 ENCOUNTER — Ambulatory Visit (INDEPENDENT_AMBULATORY_CARE_PROVIDER_SITE_OTHER): Payer: BC Managed Care – PPO

## 2020-03-29 DIAGNOSIS — J309 Allergic rhinitis, unspecified: Secondary | ICD-10-CM

## 2020-04-04 ENCOUNTER — Ambulatory Visit (INDEPENDENT_AMBULATORY_CARE_PROVIDER_SITE_OTHER): Payer: BC Managed Care – PPO

## 2020-04-04 DIAGNOSIS — J309 Allergic rhinitis, unspecified: Secondary | ICD-10-CM

## 2020-04-12 ENCOUNTER — Ambulatory Visit (INDEPENDENT_AMBULATORY_CARE_PROVIDER_SITE_OTHER): Payer: BC Managed Care – PPO | Admitting: *Deleted

## 2020-04-12 DIAGNOSIS — J309 Allergic rhinitis, unspecified: Secondary | ICD-10-CM

## 2020-04-20 ENCOUNTER — Ambulatory Visit (INDEPENDENT_AMBULATORY_CARE_PROVIDER_SITE_OTHER): Payer: BC Managed Care – PPO

## 2020-04-20 DIAGNOSIS — J309 Allergic rhinitis, unspecified: Secondary | ICD-10-CM

## 2020-04-27 ENCOUNTER — Ambulatory Visit (INDEPENDENT_AMBULATORY_CARE_PROVIDER_SITE_OTHER): Payer: BC Managed Care – PPO

## 2020-04-27 DIAGNOSIS — J309 Allergic rhinitis, unspecified: Secondary | ICD-10-CM | POA: Diagnosis not present

## 2020-05-02 ENCOUNTER — Ambulatory Visit (INDEPENDENT_AMBULATORY_CARE_PROVIDER_SITE_OTHER): Payer: BC Managed Care – PPO | Admitting: *Deleted

## 2020-05-02 DIAGNOSIS — J309 Allergic rhinitis, unspecified: Secondary | ICD-10-CM | POA: Diagnosis not present

## 2020-05-08 ENCOUNTER — Ambulatory Visit (INDEPENDENT_AMBULATORY_CARE_PROVIDER_SITE_OTHER): Payer: BC Managed Care – PPO

## 2020-05-08 DIAGNOSIS — J309 Allergic rhinitis, unspecified: Secondary | ICD-10-CM | POA: Diagnosis not present

## 2020-05-15 ENCOUNTER — Ambulatory Visit (INDEPENDENT_AMBULATORY_CARE_PROVIDER_SITE_OTHER): Payer: BC Managed Care – PPO

## 2020-05-15 DIAGNOSIS — J309 Allergic rhinitis, unspecified: Secondary | ICD-10-CM

## 2020-05-25 ENCOUNTER — Ambulatory Visit: Payer: BC Managed Care – PPO | Admitting: Family Medicine

## 2020-05-25 ENCOUNTER — Ambulatory Visit (INDEPENDENT_AMBULATORY_CARE_PROVIDER_SITE_OTHER): Payer: BC Managed Care – PPO

## 2020-05-25 ENCOUNTER — Ambulatory Visit: Payer: Self-pay

## 2020-05-25 ENCOUNTER — Encounter: Payer: Self-pay | Admitting: Family Medicine

## 2020-05-25 ENCOUNTER — Other Ambulatory Visit: Payer: Self-pay

## 2020-05-25 VITALS — BP 90/80 | HR 69 | Ht 73.0 in | Wt 196.0 lb

## 2020-05-25 DIAGNOSIS — M25551 Pain in right hip: Secondary | ICD-10-CM

## 2020-05-25 DIAGNOSIS — J309 Allergic rhinitis, unspecified: Secondary | ICD-10-CM

## 2020-05-25 IMAGING — DX DG HIP (WITH OR WITHOUT PELVIS) 2-3V*R*
3 series · 3 of 3 positions shown · non-contrast
Comparison: None.

CLINICAL DATA: Right hip pain for 3 weeks.

EXAM:
DG HIP (WITH OR WITHOUT PELVIS) 2-3V RIGHT

[pelvis ap]
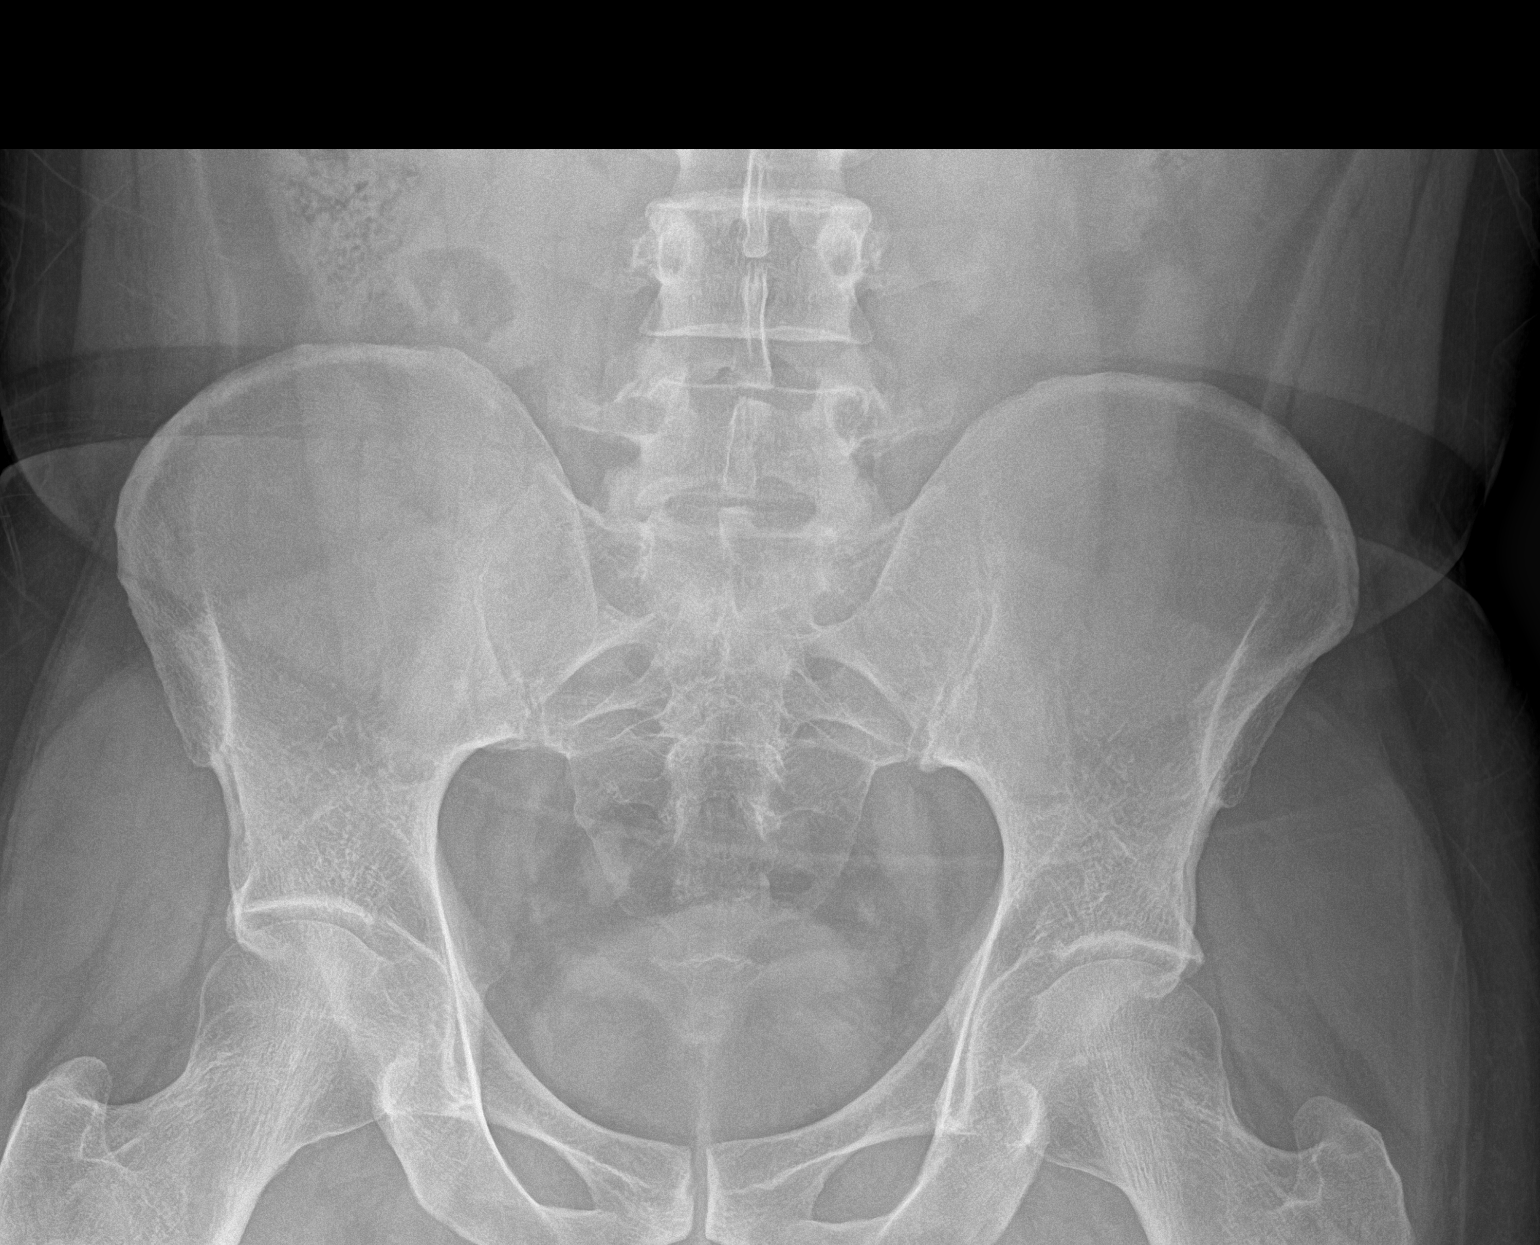

[hip ap]
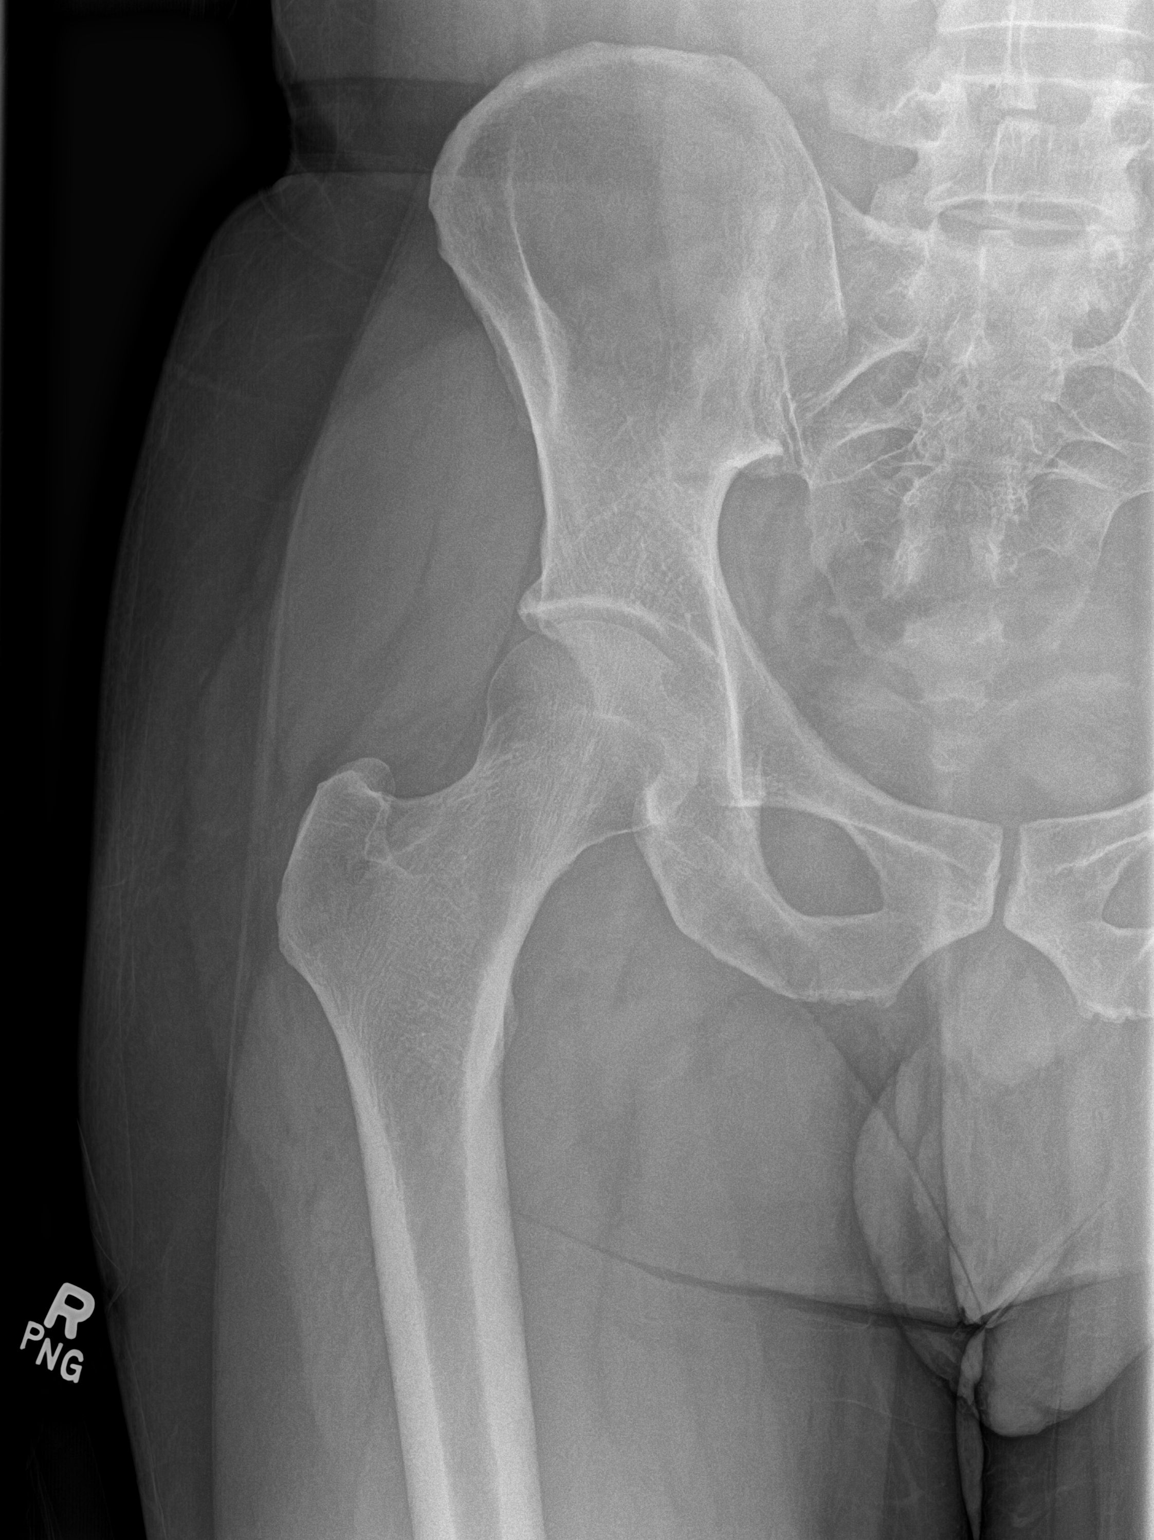

[hip frog leg]
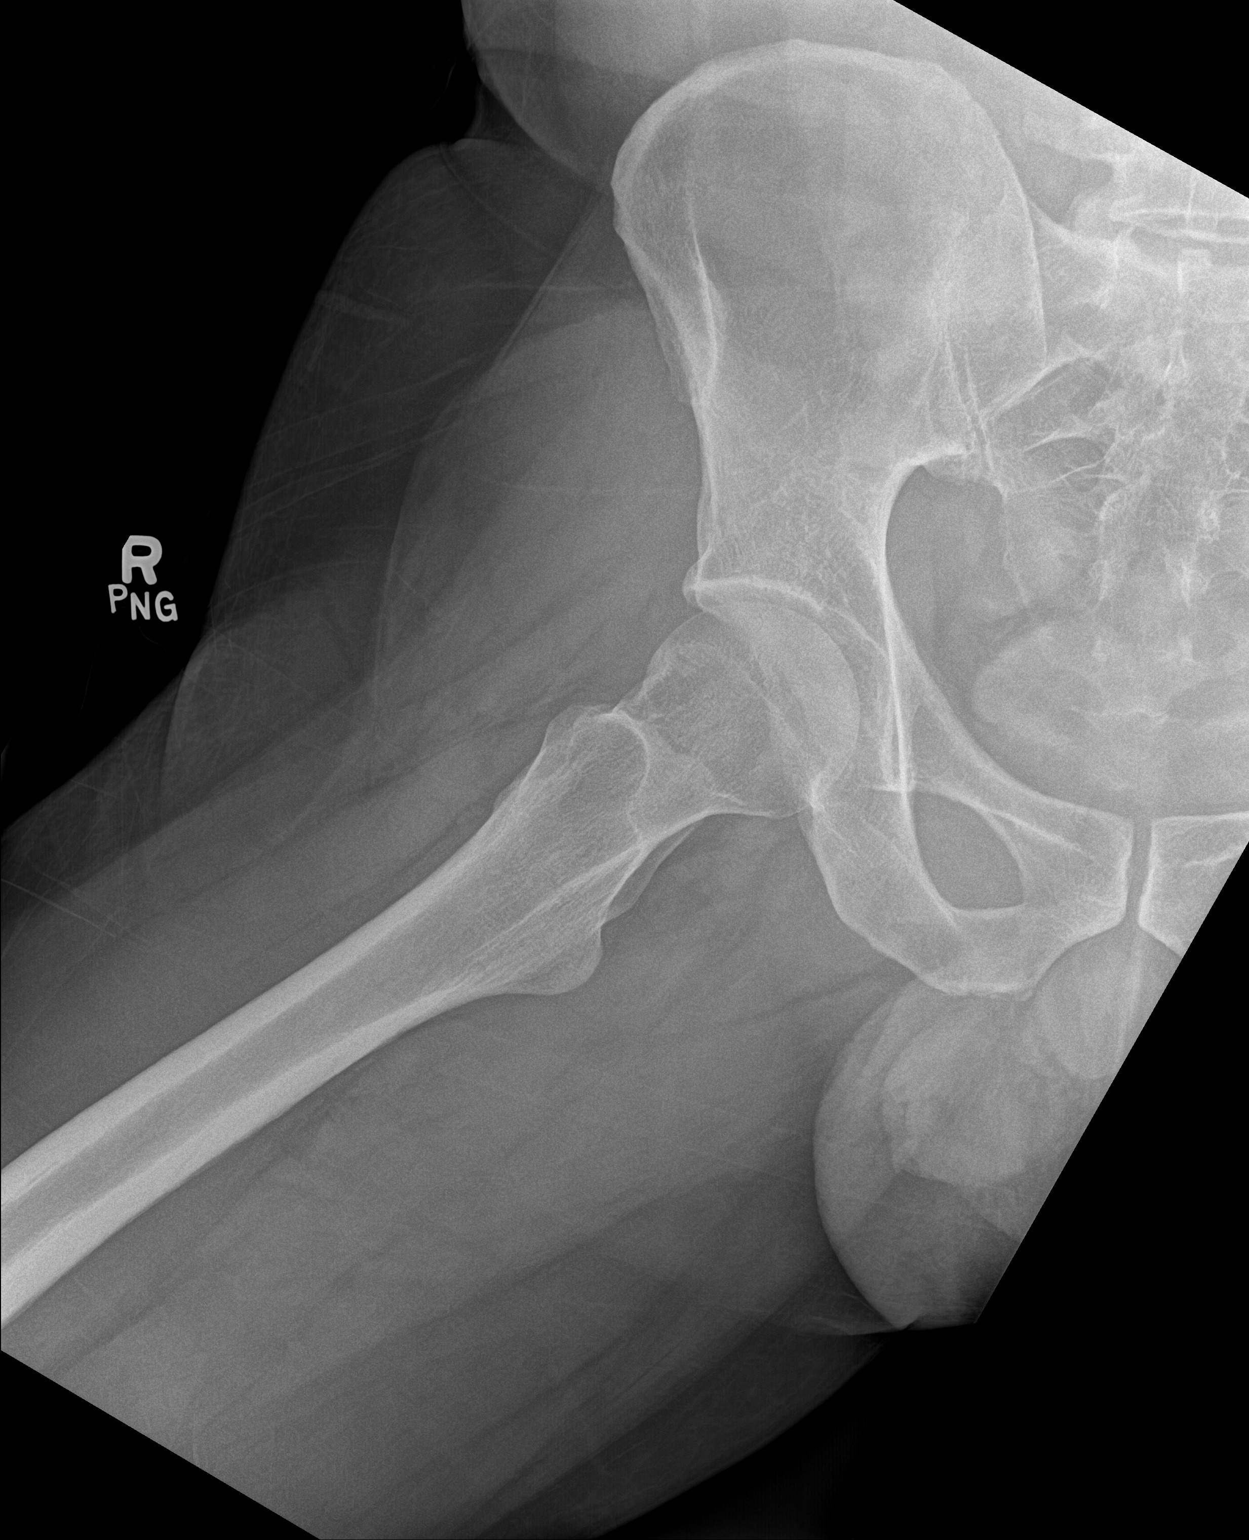

[3 of 3 positions shown; findings below may reference images not displayed]

FINDINGS: Both hips are normally located. No acute bony findings or
degenerative changes. No plain film evidence of avascular necrosis.
The pubic symphysis and SI joints are intact. No pelvic fractures or
bone lesions.
IMPRESSION: No acute bony findings or degenerative changes.

## 2020-05-25 NOTE — Patient Instructions (Addendum)
Thank you for coming in today.  I've referred you to Physical Therapy.  Let us know if you don't hear from them in one week. If Onalee Hua cannot see you soon let me know. We have other options.  Swimming may be ok.   Avoid activity that worsens your pain.   Keep me updated.  If not better in 4-6 weeks I will proceed to MRI.   I am concerned about Labrum tear, Femoral Acebular impingement (FAI), Avascular Necrosis (AVN) and Femoral Neck Stress fracture.

## 2020-05-25 NOTE — Progress Notes (Addendum)
Femoral acetabular         Subjective:    CC: hip pain  I, Debbe Odea, am serving as a scribe for Dr. Clementeen Graham.  HPI: Pt is a 44 y/o male presenting w/ c/o R hip pain X3 weeks.  He locates his pain to started in hip flexor but has noticed in the glute and hamstring is now hurting. Pain is worse in the morning when he gets up. Lorayne Bender a Physical Therapist is a friend and has been working with him but it has not been getting better and was recommended to see [place]. No MOI but does do workouts in the morning and does run. Had noticed that he was sore and stopped the workout didn't get better.    Radiating pain: started radiating down the leg Hip mechanical symptoms: no  Aggravating factors: bearing weight  Treatments tried: exercises; ibuprofen   Pertinent review of Systems: No fevers or chills  Relevant historical information: Healthy male without significant medical problems.   Objective:    Vitals:   05/25/20 1040  BP: 90/80  Pulse: 69  SpO2: 99%   General: Well Developed, well nourished, and in no acute distress.   MSK: Right hip normal-appearing Not particularly tender palpation. Range of motion limited flexion internal rotation and adduction all producing pain. Positive FABER test positive FADIR test. Strength diminished in pain with resisted flexion external rotation and abduction all 4/5. Internal rotation and adduction and mildly painful and mildly weak 4+/5. Antalgic gait. Leg lengths equal  Lab and Radiology Results  X-ray images right hip obtained today personally and independently interpreted No fractures AVN or stress fracture or significant DJD seen.  No obvious femoral acetabular impingement pattern visible. Await formal radiology review  Diagnostic Limited MSK Ultrasound of: Right hip anterior femoral acetabular joint Moderate effusion present at right hip femoral acetabular joint.  No obvious labral tear visit anterior aspect of  joint. Impression: Joint effusion    Impression and Recommendations:    Assessment and Plan: 44 y.o. male with right hip pain ongoing for 3 weeks.  Patient has been receiving physical therapy for the last 3 weeks as well for this.  He has not improved with what I would consider to be the early first conservative management strategy.  X-ray does not show obvious abnormality to my interpretation however radiology overread is still pending.  Ultrasound today does show a hip effusion which suggests some intra-articular cause of his pain such as for femoral acetabular impingement, labrum tear, avascular necrosis or potentially stress fracture.  Plan for continued physical therapy and if not better in 3-4 more weeks would proceed with MRI arthrogram to further characterize cause of pain and for potential interventional planning.    Orders Placed This Encounter  Procedures  . [place] LIMITED JOINT SPACE STRUCTURES LOW RIGHT(NO LINKED CHARGES)    Standing Status:   Future    Number of Occurrences:   1    Standing Expiration Date:   05/25/2021    Order Specific Question:   Reason for Exam (SYMPTOM  OR DIAGNOSIS REQUIRED)    Answer:   right hip pain    Order Specific Question:   Preferred imaging location?    Answer:   Adult [facility]  . DG HIP UNILAT WITH PELVIS 2-3 VIEWS RIGHT    Standing Status:   Future    Number of Occurrences:   1    Standing Expiration Date:   05/25/2021    Order  Specific Question:   Reason for Exam (SYMPTOM  OR DIAGNOSIS REQUIRED)    Answer:   eval hip pain    Order Specific Question:   Preferred imaging location?    Answer:   [facility]  . Ambulatory referral to Physical Therapy    Referral Priority:   Routine    Referral Type:   Physical Medicine    Referral Reason:   Specialty Services Required    Requested Specialty:   Physical Therapy   No orders of the defined types were placed in this encounter.   Discussed warning signs or symptoms.  Please see discharge instructions. Patient expresses understanding.   The above documentation has been reviewed and is accurate and complete Clementeen Graham, M.D.

## 2020-05-26 NOTE — Progress Notes (Signed)
Xray hip does not show arthritis or other problems.

## 2020-05-29 ENCOUNTER — Other Ambulatory Visit: Payer: Self-pay

## 2020-05-29 ENCOUNTER — Ambulatory Visit: Payer: BC Managed Care – PPO | Attending: Family Medicine | Admitting: Physical Therapy

## 2020-05-29 DIAGNOSIS — M25652 Stiffness of left hip, not elsewhere classified: Secondary | ICD-10-CM | POA: Diagnosis present

## 2020-05-29 DIAGNOSIS — R2689 Other abnormalities of gait and mobility: Secondary | ICD-10-CM

## 2020-05-29 DIAGNOSIS — M25552 Pain in left hip: Secondary | ICD-10-CM | POA: Diagnosis not present

## 2020-05-30 ENCOUNTER — Encounter: Payer: Self-pay | Admitting: Physical Therapy

## 2020-05-30 NOTE — Patient Instructions (Signed)
Access Code: [email or web address] Prepared by: Lorayne Bender  Exercises Supine Piriformis Stretch with Foot on Ground - 2 x daily - 7 x weekly - 3 sets - 3 reps - 20 hold Standing 'L' Stretch at Counter - 2 x daily - 7 x weekly - 3 sets - 3 reps - 20 hold

## 2020-05-30 NOTE — Therapy (Signed)
[facility] [address] [place], [place], [address] Phone: [phone]   Fax:  [phone]  Physical Therapy Evaluation  Patient Details  Name: Cameron Joyce MRN: [id number] Date of Birth: [birth year] Referring Provider (PT): Clementeen Graham MD    Encounter Date: 05/29/2020   PT End of Session - 05/30/20 1058    Visit Number 1    Number of Visits 6    Date for PT Re-Evaluation 07/11/20    Authorization Type BCBS    PT Start Time 1719    PT Stop Time 1800    PT Time Calculation (min) 41 min    Activity Tolerance Patient tolerated treatment well    Behavior During Therapy [facility] for tasks assessed/performed           Past Medical History:  Diagnosis Date   Allergy    Anxiety    Hyperlipidemia     Past Surgical History:  Procedure Laterality Date   COLONOSCOPY  01/2017   ESOPHAGOGASTRODUODENOSCOPY  01/2017   hiatal hernia   NASAL SEPTUM SURGERY  2018   SEPTOPLASTY     & turbinate reduction    There were no vitals filed for this visit.    Subjective Assessment - 05/30/20 1048    Subjective Patient began having anterior hip pain with exercise about a month ago. The pain has increased. It has gotten to the point where he is feelling it at night and when he is weight bearing when walking. His pain is not moving despite rest. He has been to the mD who did an x-ray. The x-ray is negative.    How long can you sit comfortably? Patient feels pain when sitting then transfering to stand    How long can you stand comfortably? at times the hip hurts in weight bearing    How long can you walk comfortably? can hurt at times with walking    Diagnostic tests X-ray: negative    Patient Stated Goals to return to exercise    Currently in Pain? Yes    Pain Score 6     Pain Location Hip    Pain Orientation Right    Pain Descriptors / Indicators Aching    Pain Type Chronic pain    Pain Radiating Towards into the right hip     Pain Onset More than a month ago    Pain Frequency Constant    Aggravating Factors  sitting, rolling in bed    Pain Relieving Factors rest    Effect of Pain on Daily Activities difficulty standing and walking              [facility] PT Assessment - 05/30/20 0001      Assessment   Medical Diagnosis right anterior hip pain     Referring Provider (PT) Clementeen Graham MD     Onset Date/Surgical Date --   about a month agoi    Hand Dominance Right    Next MD Visit 3-4 weeks     Prior Therapy was given an HEP prior to formal therapy       Precautions   Precautions None      Restrictions   Weight Bearing Restrictions No      Balance Screen   Has the patient fallen in the past 6 months No    Has the patient had a decrease in activity level because of a fear of falling?  No    Is the patient reluctant to leave their home  because of a fear of falling?  No      Home Environment   Additional Comments nothing significant       Prior Function   Level of Independence Independent    Vocation Full time employment    Medical laboratory scientific officer at [employer] Likes to exercises and do bootcamp workouts       Cognition   Overall Cognitive Status Within Functional Limits for tasks assessed    Attention Focused      Observation/Other Assessments   Focus on Therapeutic Outcomes (FOTO)  Not captrued       Sensation   Light Touch Appears Intact    Additional Comments denies parathesias       Coordination   Gross Motor Movements are Fluid and Coordinated Yes    Fine Motor Movements are Fluid and Coordinated Yes      ROM / Strength   AROM / PROM / Strength AROM;PROM;Strength      AROM   AROM Assessment Site Lumbar    Lumbar Flexion 60 degrees with pain       PROM   PROM Assessment Site Hip    Right/Left Hip Right    Right Hip Flexion --   90 degrees before pain; can be psuhed through pain    Right Hip Internal Rotation  --   significant pain      Strength    Overall Strength Comments 5/5 left     Strength Assessment Site Hip    Right/Left Hip Right    Right Hip Flexion 3+/5    Right Hip ABduction 4/5    Right Hip ADduction 4/5      Palpation   Palpation comment mild tendenress to palpation in his hip flexor; significnat spasming of his lower lumbar paraspinals and into his upper gluteal      Special Tests   Other special tests FABER (-) Hip scoure (-)       Transfers   Comments pain transfering form sit to stand       Ambulation/Gait   Gait Comments mild decrease in right single leg stance time when first ambualting from the lobby                       Objective measurements completed on examination: See above findings.       [facility] Adult PT Treatment/Exercise - 05/30/20 0001      Lumbar Exercises: Stretches   Piriformis Stretch Limitations 3x20 sec hold     Other Lumbar Stretch Exercise sink stretch for QL       Manual Therapy   Manual Therapy Joint mobilization;Soft tissue mobilization;Manual Traction    Manual therapy comments skilled palpation of trigger points      Joint Mobilization Posterior glide Grade II and III     Soft tissue mobilization to lumbar spine     Manual Traction LAD grade II and III            Trigger Point Dry Needling - 05/30/20 0001    Consent Given? Yes    Education Handout Provided Yes    Muscles Treated Back/Hip Gluteus medius;Lumbar multifidi    Gluteus Medius Response Twitch response elicited;Palpable increased muscle length    Lumbar multifidi Response Twitch response elicited;Palpable increased muscle length                PT Education - 05/30/20 1058    Education Details reviewed self  stretching; reviewed exercises    Person(s) Educated Patient    Methods Explanation;Demonstration;Tactile cues;Verbal cues    Comprehension Returned demonstration;Verbal cues required;Tactile cues required;Verbalized understanding            PT Short Term Goals - 05/30/20  1111      PT SHORT TERM GOAL #1   Title Patient will demonstate full hip flexion and IR without pain    Time 3    Period Weeks    Status New    Target Date 07/11/20      PT SHORT TERM GOAL #2   Title Patient will demnostrate 5/5 gross hip strength    Time 3    Period Weeks    Status New    Target Date 06/20/20      PT SHORT TERM GOAL #3   Title Patient will be indepdnent with basic HEP    Time 3    Period Weeks    Status New    Target Date 06/20/20             PT Long Term Goals - 05/30/20 1109      PT LONG TERM GOAL #1   Title Patient will return to running without pain    Time 3    Period Weeks    Status New    Target Date 06/20/20      PT LONG TERM GOAL #2   Title Patient will transfer sit to stand without pain    Time 3    Period Weeks    Status New    Target Date 06/20/20      PT LONG TERM GOAL #3   Title Patient will roll in bed without pain    Time 3    Period Weeks    Status New    Target Date 06/20/20                  Plan - 05/30/20 1100    Clinical Impression Statement Patient is a 44 year old male with anterior hip pain that at times radiates into his gluteal. He has midl tendenress to hip fleixonr. He has significant pain with hip flexion and IR. He also has significant spasming in his lower right lumbar spine and upper gluteal. Signs and symtptoms are could be related to an anterior impingement but after evaualtion today could also have a lumbar component. He was given stretches and light exercises to work on at home. He had an improvement of hip movement with manual therapy. Therapy also perfromed dry needling to the lumbar spine.    Examination-Activity Limitations Sit;Sleep;Stand;Locomotion Level   exercise   Stability/Clinical Decision Making Evolving/Moderate complexity   depsite rest pain has been increasing   Clinical Decision Making Low    Rehab Potential Excellent    PT Frequency 1x / week    PT Duration 6 weeks    PT  Treatment/Interventions ADLs/Self Care Home Management;Electrical Stimulation;Cryotherapy;Iontophoresis 4mg /ml Dexamethasone;Gait training;Functional mobility training;Therapeutic activities;Therapeutic exercise;Neuromuscular re-education;Manual techniques;Passive range of motion;Splinting;Dry needling    PT Next Visit Plan continue with manual therapy of the hip; continue with needling if beneficial; review strengthening exercises;    PT Home Exercise Plan glute stretch , thonmas stretch, bridge; self hip flexor triggerpoint release.    Consulted and Agree with Plan of Care Patient           Patient will benefit from skilled therapeutic intervention in order to improve the following deficits and impairments:  Decreased range of motion,  Abnormal gait, Difficulty walking, Pain, Decreased strength, Decreased mobility, Decreased activity tolerance  Visit Diagnosis: Pain in left hip  Stiffness of left hip, not elsewhere classified  Other abnormalities of gait and mobility     Problem List Patient Active Problem List   Diagnosis Date Noted   Encounter for health maintenance examination in adult 11/05/2019   Gastroesophageal reflux disease 11/05/2019   Hiatal hernia 11/05/2019   Allergic rhinitis due to pollen 11/05/2019   Hyperlipidemia 11/05/2019   Work stress 11/05/2019   Vaccine counseling 11/05/2019   Seasonal and perennial allergic rhinitis 11/01/2019   Allergic conjunctivitis 11/01/2019    Dessie Coma PT DPT  05/30/2020, 1:27 PM  [facility] [address] [place], [place], [address] Phone: [phone]   Fax:  [phone]  Name: Cameron Joyce MRN: [id number] Date of Birth: 07-24-1976

## 2020-05-31 ENCOUNTER — Ambulatory Visit: Payer: BC Managed Care – PPO | Admitting: Physical Therapy

## 2020-06-01 ENCOUNTER — Ambulatory Visit (INDEPENDENT_AMBULATORY_CARE_PROVIDER_SITE_OTHER): Payer: BC Managed Care – PPO

## 2020-06-01 DIAGNOSIS — J309 Allergic rhinitis, unspecified: Secondary | ICD-10-CM

## 2020-06-05 ENCOUNTER — Other Ambulatory Visit: Payer: Self-pay

## 2020-06-05 ENCOUNTER — Ambulatory Visit: Payer: BC Managed Care – PPO | Admitting: Physical Therapy

## 2020-06-05 DIAGNOSIS — R2689 Other abnormalities of gait and mobility: Secondary | ICD-10-CM

## 2020-06-05 DIAGNOSIS — M25552 Pain in left hip: Secondary | ICD-10-CM | POA: Diagnosis not present

## 2020-06-05 DIAGNOSIS — M25652 Stiffness of left hip, not elsewhere classified: Secondary | ICD-10-CM

## 2020-06-06 ENCOUNTER — Ambulatory Visit (INDEPENDENT_AMBULATORY_CARE_PROVIDER_SITE_OTHER): Payer: BC Managed Care – PPO

## 2020-06-06 ENCOUNTER — Encounter: Payer: Self-pay | Admitting: Physical Therapy

## 2020-06-06 DIAGNOSIS — J309 Allergic rhinitis, unspecified: Secondary | ICD-10-CM

## 2020-06-06 NOTE — Therapy (Signed)
[facility] [address] [place], [place], [address] Phone: [phone]   Fax:  [phone]  Physical Therapy Treatment  Patient Details  Name: Cameron Joyce MRN: [id number] Date of Birth: 1975-12-30 Referring Provider (PT): Clementeen Graham MD    Encounter Date: 06/05/2020   PT End of Session - 06/06/20 0831    Visit Number 2    Number of Visits 6    Date for PT Re-Evaluation 07/11/20    Authorization Type BCBS    PT Start Time 0515    PT Stop Time 0558    PT Time Calculation (min) 43 min    Activity Tolerance Patient tolerated treatment well    Behavior During Therapy [facility] for tasks assessed/performed           Past Medical History:  Diagnosis Date  . Allergy   . Anxiety   . Hyperlipidemia     Past Surgical History:  Procedure Laterality Date  . COLONOSCOPY  01/2017  . ESOPHAGOGASTRODUODENOSCOPY  01/2017   hiatal hernia  . NASAL SEPTUM SURGERY  2018  . SEPTOPLASTY     & turbinate reduction    There were no vitals filed for this visit.   Subjective Assessment - 06/06/20 0803    Subjective Patient reports no real difference. He felt a littleb better last week but walked and had significant pain on Saturday. When he sits his first few steps are painful    How long can you sit comfortably? Patient feels pain when sitting then transfering to stand    How long can you stand comfortably? at times the hip hurts in weight bearing    How long can you walk comfortably? can hurt at times with walking    Diagnostic tests X-ray: negative    Patient Stated Goals to return to exercise    Currently in Pain? Yes    Pain Score 6     Pain Location Hip    Pain Orientation Right    Pain Descriptors / Indicators Aching    Pain Type Chronic pain    Pain Radiating Towards into the right hip    Pain Onset More than a month ago    Pain Frequency Constant    Aggravating Factors  sitting , rolling in bed    Pain Relieving Factors  rest    Effect of Pain on Daily Activities difficulty standing and walking    Multiple Pain Sites No                             [facility] Adult PT Treatment/Exercise - 06/06/20 0001      Exercises   Exercises Lumbar      Lumbar Exercises: Stretches   Piriformis Stretch Limitations 3x20 sec hold       Lumbar Exercises: Standing   Other Standing Lumbar Exercises dead lift from chair 10lb x15 15 lb x15; lateral band walk green 2x10;       Lumbar Exercises: Supine   Bridge Limitations x20     Other Supine Lumbar Exercises clamshell x20 green       Manual Therapy   Manual Therapy Joint mobilization;Soft tissue mobilization;Manual Traction    Manual therapy comments skilled palpation of trigger points      Joint Mobilization Posterior glide Grade II and III ; anterior glide with IR movment; lateral glide for decompression with strap    Soft tissue mobilization to lumbar spine  Manual Traction LAD grade II and III                  PT Education - 06/06/20 0809    Education Details reviewed updated HEP and ther-ex    Person(s) Educated Patient    Methods Explanation;Demonstration;Tactile cues;Verbal cues            PT Short Term Goals - 06/06/20 0835      PT SHORT TERM GOAL #1   Title Patient will demonstate full hip flexion and IR without pain    Time 3    Period Weeks    Status On-going    Target Date 07/11/20      PT SHORT TERM GOAL #2   Title Patient will demnostrate 5/5 gross hip strength    Time 3    Period Weeks    Status On-going    Target Date 06/20/20      PT SHORT TERM GOAL #3   Title Patient will be indepdnent with basic HEP    Time 3    Period Weeks    Status On-going    Target Date 06/20/20             PT Long Term Goals - 05/30/20 1109      PT LONG TERM GOAL #1   Title Patient will return to running without pain    Time 3    Period Weeks    Status New    Target Date 06/20/20      PT LONG TERM GOAL #2   Title  Patient will transfer sit to stand without pain    Time 3    Period Weeks    Status New    Target Date 06/20/20      PT LONG TERM GOAL #3   Title Patient will roll in bed without pain    Time 3    Period Weeks    Status New    Target Date 06/20/20                 Plan - 06/06/20 0831    Clinical Impression Statement Therapy added mobilization with movement. We trialed an anterior mobilization with IR movement. He had better IR with the mobilization but very little carry over. Therapy reviewed exercsies at home. The exercises did't seem to exacerbate his symptoms. He was advised to continue with the exercises as long as they don't cause any significnat pain. therapy also trialed a lateral glide. We will schedule the patient 1 more visit. He he continues to have similiar pain that is not improving we will send him back to the MD for further follow up.    Examination-Activity Limitations Sit;Sleep;Stand;Locomotion Level    Stability/Clinical Decision Making Evolving/Moderate complexity    Clinical Decision Making Low    Rehab Potential Excellent    PT Frequency 1x / week    PT Duration 6 weeks    PT Treatment/Interventions ADLs/Self Care Home Management;Electrical Stimulation;Cryotherapy;Iontophoresis 4mg /ml Dexamethasone;Gait training;Functional mobility training;Therapeutic activities;Therapeutic exercise;Neuromuscular re-education;Manual techniques;Passive range of motion;Splinting;Dry needling    PT Next Visit Plan continue with manual therapy of the hip; continue with needling if beneficial; review strengthening exercises;    PT Home Exercise Plan glute stretch , thonmas stretch, bridge; self hip flexor triggerpoint release.    Consulted and Agree with Plan of Care Patient           Patient will benefit from skilled therapeutic intervention in order to improve the following deficits and  impairments:  Decreased range of motion, Abnormal gait, Difficulty walking, Pain,  Decreased strength, Decreased mobility, Decreased activity tolerance  Visit Diagnosis: Pain in left hip  Stiffness of left hip, not elsewhere classified  Other abnormalities of gait and mobility     Problem List Patient Active Problem List   Diagnosis Date Noted  . Encounter for health maintenance examination in adult 11/05/2019  . Gastroesophageal reflux disease 11/05/2019  . Hiatal hernia 11/05/2019  . Allergic rhinitis due to pollen 11/05/2019  . Hyperlipidemia 11/05/2019  . Work stress 11/05/2019  . Vaccine counseling 11/05/2019  . Seasonal and perennial allergic rhinitis 11/01/2019  . Allergic conjunctivitis 11/01/2019    Dessie Coma PT DPT  06/06/2020, 8:41 AM  [facility] [address] [place], [place], [address] Phone: [phone]   Fax:  [phone]  Name: Reagan Klemz MRN: [id number] Date of Birth: 1975/11/19

## 2020-06-12 ENCOUNTER — Other Ambulatory Visit: Payer: Self-pay

## 2020-06-12 ENCOUNTER — Ambulatory Visit: Payer: BC Managed Care – PPO | Admitting: Physical Therapy

## 2020-06-12 DIAGNOSIS — M25552 Pain in left hip: Secondary | ICD-10-CM | POA: Diagnosis not present

## 2020-06-12 DIAGNOSIS — M25652 Stiffness of left hip, not elsewhere classified: Secondary | ICD-10-CM

## 2020-06-12 DIAGNOSIS — R2689 Other abnormalities of gait and mobility: Secondary | ICD-10-CM

## 2020-06-13 ENCOUNTER — Ambulatory Visit (INDEPENDENT_AMBULATORY_CARE_PROVIDER_SITE_OTHER): Payer: BC Managed Care – PPO

## 2020-06-13 ENCOUNTER — Encounter: Payer: Self-pay | Admitting: Physical Therapy

## 2020-06-13 DIAGNOSIS — J309 Allergic rhinitis, unspecified: Secondary | ICD-10-CM

## 2020-06-13 NOTE — Therapy (Signed)
[facility] [address] [place], [place], [address] Phone: [phone]   Fax:  [phone]  Physical Therapy Treatment  Patient Details  Name: Cameron Joyce MRN: [id number] Date of Birth: [birth year] Referring Provider (PT): Clementeen Graham MD    Encounter Date: 06/12/2020   PT End of Session - 06/13/20 0936    Visit Number 3    Number of Visits 6    Date for PT Re-Evaluation 07/11/20    Authorization Type BCBS    PT Start Time 1715    PT Stop Time 1759    PT Time Calculation (min) 44 min    Activity Tolerance Patient tolerated treatment well    Behavior During Therapy [facility] for tasks assessed/performed           Past Medical History:  Diagnosis Date  . Allergy   . Anxiety   . Hyperlipidemia     Past Surgical History:  Procedure Laterality Date  . COLONOSCOPY  01/2017  . ESOPHAGOGASTRODUODENOSCOPY  01/2017   hiatal hernia  . NASAL SEPTUM SURGERY  2018  . SEPTOPLASTY     & turbinate reduction    There were no vitals filed for this visit.   Subjective Assessment - 06/13/20 1002    Subjective Patient feels like it has been a little better. He has been standing and walking over the weekend without much pain. He is still having some pain with rolling ut it has been better.    How long can you sit comfortably? Patient feels pain when sitting then transfering to stand    How long can you stand comfortably? at times the hip hurts in weight bearing    How long can you walk comfortably? can hurt at times with walking    Diagnostic tests X-ray: negative    Patient Stated Goals to return to exercise    Currently in Pain? No/denies                             [facility] Adult PT Treatment/Exercise - 06/13/20 0001      Lumbar Exercises: Stretches   Piriformis Stretch Limitations 3x20 sec hold       Lumbar Exercises: Standing   Other Standing Lumbar Exercises dead lift from chair 10lb x15 15 lb x15;      Other Standing Lumbar Exercises kettle bell swing 15 2x15; step up and drive 4 inch 0V77; side step and drive 9T90       Lumbar Exercises: Supine   Bridge Limitations x20     Other Supine Lumbar Exercises clamshell x20 green       Manual Therapy   Manual Therapy Joint mobilization;Soft tissue mobilization;Manual Traction    Manual therapy comments skilled palpation of trigger points      Joint Mobilization Posterior glide Grade II and III ; anterior glide with IR movment; lateral glide for decompression with strap    Soft tissue mobilization to lumbar spine     Manual Traction LAD grade II and III                  PT Education - 06/13/20 0935    Education Details HEP and symptom management    Person(s) Educated Patient    Methods Explanation;Demonstration;Verbal cues;Tactile cues    Comprehension Returned demonstration;Verbal cues required;Tactile cues required;Verbalized understanding            PT Short Term Goals - 06/13/20 3009  PT SHORT TERM GOAL #1   Title Patient will demonstate full hip flexion and IR without pain    Time 3    Period Weeks    Status On-going    Target Date 07/11/20      PT SHORT TERM GOAL #2   Title Patient will demnostrate 5/5 gross hip strength    Time 3    Period Weeks    Status On-going    Target Date 06/20/20      PT SHORT TERM GOAL #3   Title Patient will be indepdnent with basic HEP    Time 3    Period Weeks    Status On-going    Target Date 06/20/20             PT Long Term Goals - 05/30/20 1109      PT LONG TERM GOAL #1   Title Patient will return to running without pain    Time 3    Period Weeks    Status New    Target Date 06/20/20      PT LONG TERM GOAL #2   Title Patient will transfer sit to stand without pain    Time 3    Period Weeks    Status New    Target Date 06/20/20      PT LONG TERM GOAL #3   Title Patient will roll in bed without pain    Time 3    Period Weeks    Status New    Target  Date 06/20/20                 Plan - 06/13/20 0936    Clinical Impression Statement Therapy continues to work on 4 way hip mobilization. His active IR and flexion are improving. He tolerated there-ex well. he has been able to stand and walk with less pain. Therapy added kettle bell swings. He is making progress.    Examination-Activity Limitations Sit;Sleep;Stand;Locomotion Level    Stability/Clinical Decision Making Evolving/Moderate complexity    Clinical Decision Making Low    Rehab Potential Excellent    PT Frequency 1x / week    PT Duration 6 weeks    PT Treatment/Interventions ADLs/Self Care Home Management;Electrical Stimulation;Cryotherapy;Iontophoresis 4mg /ml Dexamethasone;Gait training;Functional mobility training;Therapeutic activities;Therapeutic exercise;Neuromuscular re-education;Manual techniques;Passive range of motion;Splinting;Dry needling    PT Next Visit Plan continue with manual therapy of the hip; continue with needling if beneficial; review strengthening exercises;    PT Home Exercise Plan glute stretch , thonmas stretch, bridge; self hip flexor triggerpoint release.    Consulted and Agree with Plan of Care Patient           Patient will benefit from skilled therapeutic intervention in order to improve the following deficits and impairments:  Decreased range of motion, Abnormal gait, Difficulty walking, Pain, Decreased strength, Decreased mobility, Decreased activity tolerance  Visit Diagnosis: Pain in left hip  Stiffness of left hip, not elsewhere classified  Other abnormalities of gait and mobility     Problem List Patient Active Problem List   Diagnosis Date Noted  . Encounter for health maintenance examination in adult 11/05/2019  . Gastroesophageal reflux disease 11/05/2019  . Hiatal hernia 11/05/2019  . Allergic rhinitis due to pollen 11/05/2019  . Hyperlipidemia 11/05/2019  . Work stress 11/05/2019  . Vaccine counseling 11/05/2019  .  Seasonal and perennial allergic rhinitis 11/01/2019  . Allergic conjunctivitis 11/01/2019    11/03/2019 PT DPT  06/13/2020, 10:03 AM  [place]  [facility] [address] [place], [place], [address] Phone: [phone]   Fax:  [phone]  Name: Cameron Joyce MRN: [id number] Date of Birth: 1976/06/11

## 2020-06-15 ENCOUNTER — Ambulatory Visit: Payer: BC Managed Care – PPO | Admitting: Physical Therapy

## 2020-06-19 ENCOUNTER — Encounter: Payer: Self-pay | Admitting: Physical Therapy

## 2020-06-19 ENCOUNTER — Other Ambulatory Visit: Payer: Self-pay

## 2020-06-19 ENCOUNTER — Ambulatory Visit: Payer: BC Managed Care – PPO | Attending: Family Medicine | Admitting: Physical Therapy

## 2020-06-19 DIAGNOSIS — M25552 Pain in left hip: Secondary | ICD-10-CM | POA: Insufficient documentation

## 2020-06-19 DIAGNOSIS — R2689 Other abnormalities of gait and mobility: Secondary | ICD-10-CM | POA: Insufficient documentation

## 2020-06-19 DIAGNOSIS — M25652 Stiffness of left hip, not elsewhere classified: Secondary | ICD-10-CM | POA: Diagnosis present

## 2020-06-20 ENCOUNTER — Encounter: Payer: Self-pay | Admitting: Family Medicine

## 2020-06-20 DIAGNOSIS — M25551 Pain in right hip: Secondary | ICD-10-CM

## 2020-06-20 NOTE — Therapy (Addendum)
[place], [place], [address] Phone: [phone]   Fax:  [phone]  Physical Therapy Treatment/Discharge   Patient Details  Name: Cameron Joyce MRN: [id number] Date of Birth: 10-02-1975 Referring Provider (PT): Lynne Leader MD    Encounter Date: 06/19/2020   PT End of Session - 06/20/20 1350    Visit Number 4    Number of Visits 6    Date for PT Re-Evaluation 07/11/20    Authorization Type BC[employer]    PT Start Time 2919    PT Stop Time 1753    PT Time Calculation (min) 38 min    Activity Tolerance Patient tolerated treatment well    Behavior During Therapy [facility] for tasks assessed/performed           Past Medical History:  Diagnosis Date  . Allergy   . Anxiety   . Hyperlipidemia     Past Surgical History:  Procedure Laterality Date  . COLONOSCOPY  01/2017  . ESOPHAGOGASTRODUODENOSCOPY  01/2017   hiatal hernia  . NASAL SEPTUM SURGERY  2018  . SEPTOPLASTY     & turbinate reduction    There were no vitals filed for this visit.   Subjective Assessment - 06/19/20 1719    Subjective Patient continues to feel like the pain is still there. He feles like it is better but he still has pain when he sits. He also is having some difficulty running. He has returned to light work outs. His HEP has not been irritating.    How long can you sit comfortably? Patient feels pain when sitting then transfering to stand    How long can you stand comfortably? at times the hip hurts in weight bearing    How long can you walk comfortably? can hurt at times with walking    Diagnostic tests X-ray: negative    Patient Stated Goals to return to exercise    Currently in Pain? Yes    Pain Score 3     Pain Location Hip    Pain Orientation Right    Pain Descriptors / Indicators Aching    Pain Type Chronic pain    Pain Radiating Towards into the right hip    Pain Onset More than a month ago    Pain Frequency Constant                              [facility] Adult PT Treatment/Exercise - 06/20/20 0001      Lumbar Exercises: Aerobic   Nustep L5 5 min no increased pain      Manual Therapy   Manual Therapy Joint mobilization;Soft tissue mobilization;Manual Traction    Manual therapy comments skilled palpation of trigger points      Joint Mobilization Posterior glide Grade II and III ; anterior glide with IR movment; lateral glide for decompression with strap    Soft tissue mobilization to lumbar spine     Manual Traction LAD grade II and III                  PT Education - 06/20/20 1349    Education Details reviewed progression of activity outside of therapy.    Person(s) Educated Patient    Methods Explanation;Demonstration;Tactile cues;Verbal cues    Comprehension Verbalized understanding;Returned demonstration;Verbal cues required;Tactile cues required            PT Short Term Goals - 06/13/20 1660  PT SHORT TERM GOAL #1   Title Patient will demonstate full hip flexion and IR without pain    Time 3    Period Weeks    Status On-going    Target Date 07/11/20      PT SHORT TERM GOAL #2   Title Patient will demnostrate 5/5 gross hip strength    Time 3    Period Weeks    Status On-going    Target Date 06/20/20      PT SHORT TERM GOAL #3   Title Patient will be indepdnent with basic HEP    Time 3    Period Weeks    Status On-going    Target Date 06/20/20             PT Long Term Goals - 05/30/20 1109      PT LONG TERM GOAL #1   Title Patient will return to running without pain    Time 3    Period Weeks    Status New    Target Date 06/20/20      PT LONG TERM GOAL #2   Title Patient will transfer sit to stand without pain    Time 3    Period Weeks    Status New    Target Date 06/20/20      PT LONG TERM GOAL #3   Title Patient will roll in bed without pain    Time 3    Period Weeks    Status New    Target Date 06/20/20                  Plan - 06/20/20 1407    Clinical Impression Statement Patient has made progress but not enough over the past 6 weeks to return to full athletic activity. He is also still having difficulty sitting at working for a long period of time. He has a full exercise program. We will put him on hold at this time. He will return to the MD for further follow up.    Examination-Activity Limitations Sit;Sleep;Stand;Locomotion Level    Stability/Clinical Decision Making Evolving/Moderate complexity    Clinical Decision Making Low    Rehab Potential Excellent    PT Frequency 1x / week    PT Duration 6 weeks    PT Treatment/Interventions ADLs/Self Care Home Management;Electrical Stimulation;Cryotherapy;Iontophoresis 34m/ml Dexamethasone;Gait training;Functional mobility training;Therapeutic activities;Therapeutic exercise;Neuromuscular re-education;Manual techniques;Passive range of motion;Splinting;Dry needling    PT Next Visit Plan continue with manual therapy of the hip; continue with needling if beneficial; review strengthening exercises;    PT Home Exercise Plan glute stretch , thonmas stretch, bridge; self hip flexor triggerpoint release.    Consulted and Agree with Plan of Care Patient           Patient will benefit from skilled therapeutic intervention in order to improve the following deficits and impairments:  Decreased range of motion, Abnormal gait, Difficulty walking, Pain, Decreased strength, Decreased mobility, Decreased activity tolerance  Visit Diagnosis: Pain in left hip  Stiffness of left hip, not elsewhere classified  Other abnormalities of gait and mobility     Problem List Patient Active Problem List   Diagnosis Date Noted  . Encounter for health maintenance examination in adult 11/05/2019  . Gastroesophageal reflux disease 11/05/2019  . Hiatal hernia 11/05/2019  . Allergic rhinitis due to pollen 11/05/2019  . Hyperlipidemia 11/05/2019  . Work stress  11/05/2019  . Vaccine counseling 11/05/2019  . Seasonal and perennial allergic rhinitis 11/01/2019  .  Allergic conjunctivitis 11/01/2019   PHYSICAL THERAPY DISCHARGE SUMMARY  Visits from Start of Care: 4  Current functional level related to goals / functional outcomes: Continued pain. Patient sent back to the MD   Remaining deficits: Continued pain while sitting    Education / Equipment: HEP  Plan: Patient agrees to discharge.  Patient goals were not met. Patient is being discharged due to lack of progress.  ?????      Carney Living PT DPT  06/20/2020, 2:13 PM  [facility] [address] [place], [place], [address] Phone: [phone]   Fax:  [phone]  Name: Amaury Kuzel MRN: [id number] Date of Birth: [birth year]

## 2020-06-22 ENCOUNTER — Ambulatory Visit (INDEPENDENT_AMBULATORY_CARE_PROVIDER_SITE_OTHER): Payer: BC Managed Care – PPO

## 2020-06-22 DIAGNOSIS — J309 Allergic rhinitis, unspecified: Secondary | ICD-10-CM | POA: Diagnosis not present

## 2020-06-29 ENCOUNTER — Other Ambulatory Visit: Payer: Self-pay

## 2020-06-29 ENCOUNTER — Telehealth: Payer: Self-pay | Admitting: Medical

## 2020-06-29 MED ORDER — TRAMADOL HCL 50 MG PO TABS: 50.0000 mg | ORAL_TABLET | Freq: Three times a day (TID) | ORAL | 0 refills | Status: DC | PRN

## 2020-06-29 NOTE — Addendum Note (Signed)
Addended by: Rodolph Bong on: 06/29/2020 06:39 AM   Modules accepted: Orders

## 2020-06-29 NOTE — Telephone Encounter (Signed)
I received a refill request for Lexapro.  See if he is still using this.

## 2020-06-30 NOTE — Telephone Encounter (Signed)
Lmom for patient to call back and let us know whether he is still using this medication or not.

## 2020-07-01 ENCOUNTER — Other Ambulatory Visit: Payer: Self-pay

## 2020-07-03 ENCOUNTER — Ambulatory Visit (INDEPENDENT_AMBULATORY_CARE_PROVIDER_SITE_OTHER): Payer: BC Managed Care – PPO

## 2020-07-03 ENCOUNTER — Other Ambulatory Visit: Payer: BC Managed Care – PPO

## 2020-07-03 ENCOUNTER — Ambulatory Visit (INDEPENDENT_AMBULATORY_CARE_PROVIDER_SITE_OTHER): Payer: BC Managed Care – PPO | Admitting: Sports Medicine

## 2020-07-03 ENCOUNTER — Other Ambulatory Visit: Payer: Self-pay

## 2020-07-03 DIAGNOSIS — M25551 Pain in right hip: Secondary | ICD-10-CM

## 2020-07-03 DIAGNOSIS — G8929 Other chronic pain: Secondary | ICD-10-CM

## 2020-07-03 DIAGNOSIS — S72044A Nondisplaced fracture of base of neck of right femur, initial encounter for closed fracture: Secondary | ICD-10-CM

## 2020-07-03 IMAGING — MR MR HIP*R* W/CM
5 series · 40 of 40 positions shown · IV contrast (gadavist)
Comparison: None.

CLINICAL DATA: Chronic right-sided hip pain

EXAM:
MRI OF THE RIGHT HIP WITH CONTRAST
TECHNIQUE: Multiplanar, multisequence MR imaging was performed following the
administration of intravenous contrast.
CONTRAST:  1mL GADAVIST GADOBUTROL 1 MMOL/ML IV SOLN

[Series 4: T2 fat-sat · coronal · 4.0mm · 1.33mm/px · 9 of 37 slices shown]
[im 1/37]
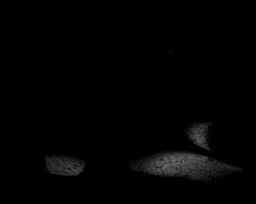
[im 5/37]
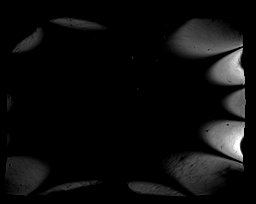
[im 10/37]
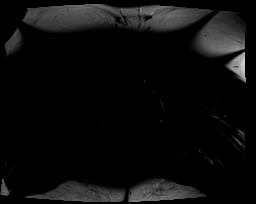
[im 14/37]
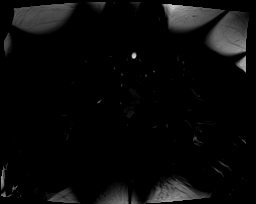
[im 19/37]
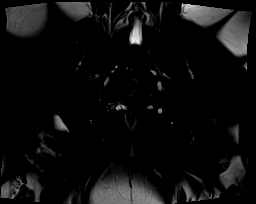
[im 23/37]
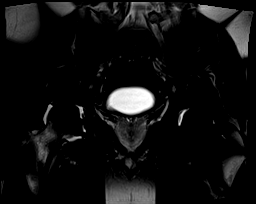
[im 28/37]
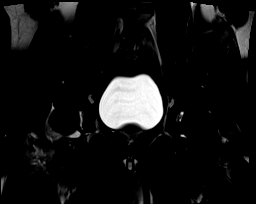
[im 32/37]
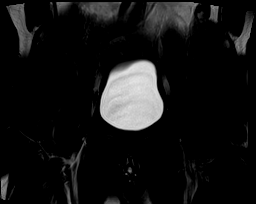
[im 37/37]
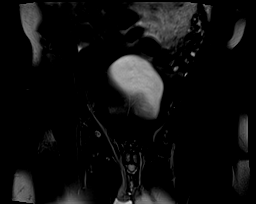

[Series 5: T1 · coronal · 4.0mm · 1.33mm/px · 11 of 41 slices shown]
[im 1/41]
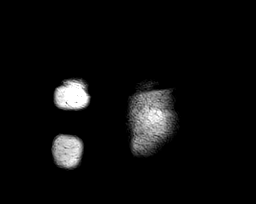
[im 5/41]
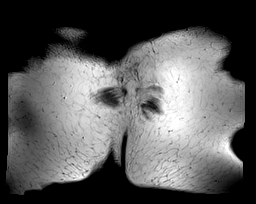
[im 9/41]
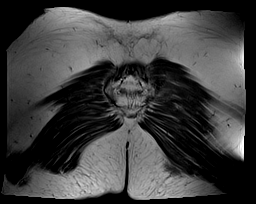
[im 13/41]
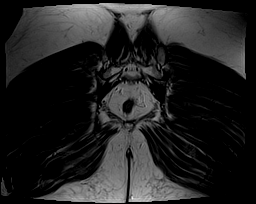
[im 17/41]
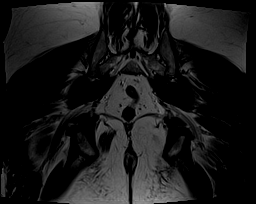
[im 21/41]
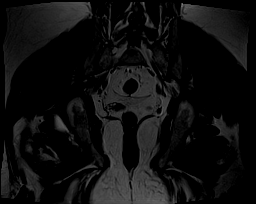
[im 25/41]
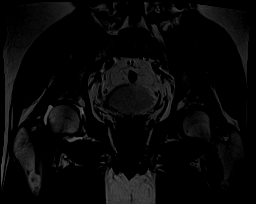
[im 29/41]
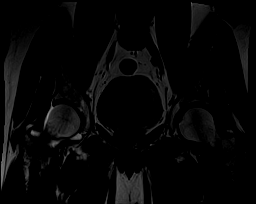
[im 33/41]
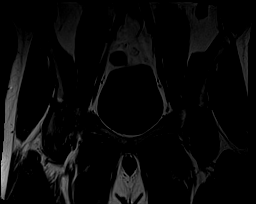
[im 37/41]
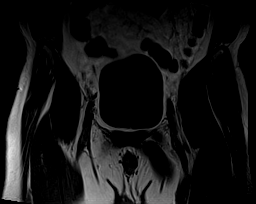
[im 41/41]
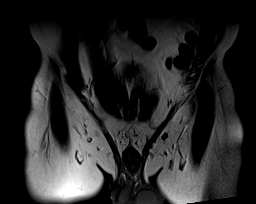

[Series 6: T1 fat-sat · coronal · 4.0mm · 0.70mm/px · 7 of 25 slices shown (1 of 3)]
[im 1/25]
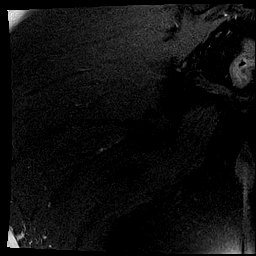
[im 5/25]
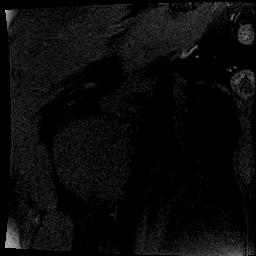
[im 9/25]
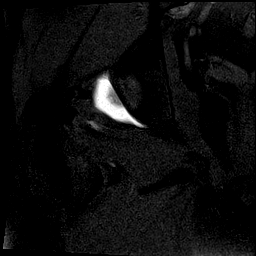
[im 13/25]
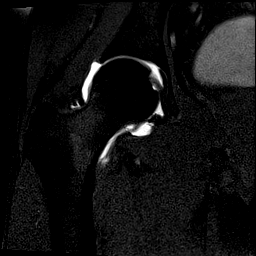
[im 17/25]
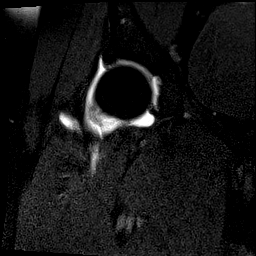
[im 21/25]
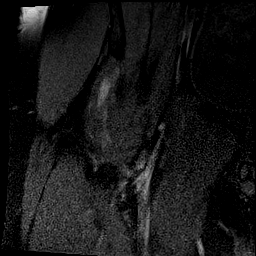
[im 25/25]
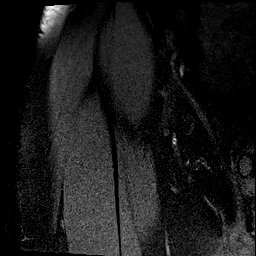

[Series 7: T1 fat-sat · sagittal · 4.0mm · 0.70mm/px · 8 of 28 slices shown (2 of 3)]
[im 1/28]
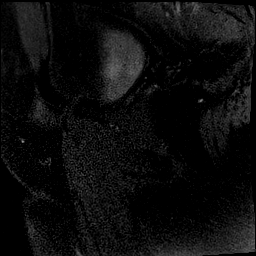
[im 4/28]
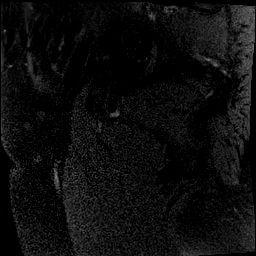
[im 8/28]
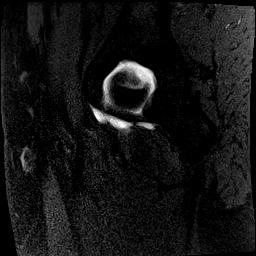
[im 12/28]
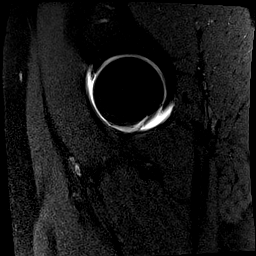
[im 16/28]
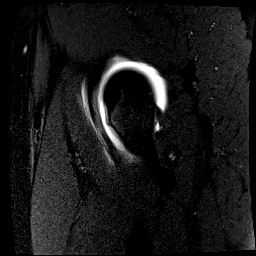
[im 20/28]
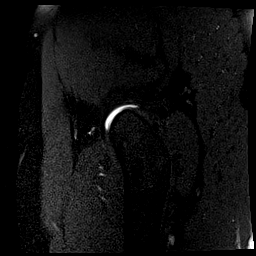
[im 24/28]
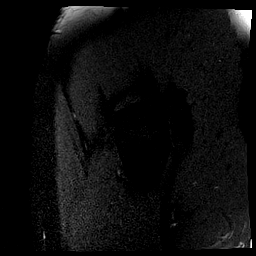
[im 28/28]
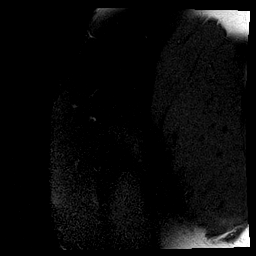

[Series 8: T1 fat-sat · oblique · 4.0mm · 0.70mm/px · 5 of 18 slices shown (3 of 3)]
[im 1/18]
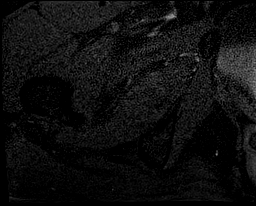
[im 5/18]
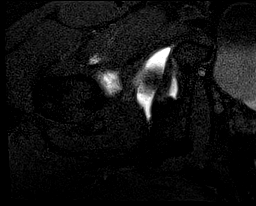
[im 9/18]
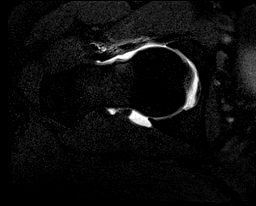
[im 13/18]
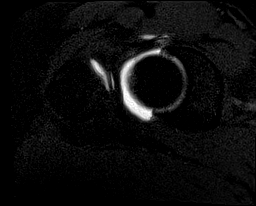
[im 18/18]
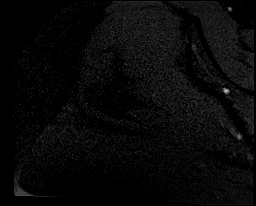

[40 of 40 positions shown; findings below may reference images not displayed]

FINDINGS: Bones: There is a nondisplaced transcervical right femoral neck
fracture. Surrounding marrow edema is seen. There is slight
periosteal thickening seen along the medial cortex of the femoral
neck. The femoral head is normal in shape, configuration and
sphericity. No osseous bump at the femoral head neck junction. The
visualized bony pelvis appears normal. The visualized sacroiliac
joints and symphysis pubis appear normal.

Articular cartilage and labrum

Articular cartilage: No focal chondral defect or subchondral signal
abnormality identified. The ligamentum teres is intact

Labrum: Fraying is seen at the anterior superior labrum. No
displaced labral tear is seen.

Joint or bursal effusion

Joint effusion: No significant hip joint effusion.

Bursae: No focal periarticular fluid collection.

Muscles and tendons

Muscles and tendons: The muscles surrounding hip are normal in
appearance without evidence of tear or atrophy. The visualized
gluteus, hamstring and iliopsoas tendons appear normal. The
piriformis muscles appear symmetric.

Other findings

Miscellaneous: The visualized internal pelvic contents appear
unremarkable.
IMPRESSION: Nondisplaced transcervical femoral neck fracture with surrounding
marrow edema.

Anterior superior labral fraying.  No displaced labral tear.

These results will be called to the ordering clinician or
representative by the Radiologist Assistant, and communication
documented in the PACS or [REDACTED].

## 2020-07-03 MED ORDER — GADOBUTROL 1 MMOL/ML IV SOLN
1.0000 mL | Freq: Once | INTRAVENOUS | Status: AC | PRN
  Administered 2020-07-03: 1 mL via INTRAVENOUS

## 2020-07-03 NOTE — Assessment & Plan Note (Signed)
Long history of right groin pain with mechanical symptoms, seen by Dr. Denyse Amass and referred to me for injection for hip MR arthrogram. Return as needed.

## 2020-07-03 NOTE — Progress Notes (Signed)
    Procedures performed today:    Procedure: Real-time Ultrasound Guided gadolinium contrast injection of right hip joint Device: Samsung HS60  Verbal informed consent obtained.  Time-out conducted.  Noted no overlying erythema, induration, or other signs of local infection.  Skin prepped in a sterile fashion.  Local anesthesia: Topical Ethyl chloride.  With sterile technique and under real time ultrasound guidance: Noted some synovitis as well as evidence of hip impingement and mild osteoarthritis, I advanced a 22-gauge needle to the femoral head/neck junction, contacted bone and then injected 1 cc kenalog 40, 2 cc lidocaine, 2 cc bupivacaine, syringe again switched and 0.1 cc gadolinium injected, syringe switched again and 10 cc sterile saline used to distend the joint. Joint visualized and capsule seen distending confirming intra-articular placement of contrast material and medication. Completed without difficulty  Advised to call if fevers/chills, erythema, induration, drainage, or persistent bleeding.  Images permanently stored in PACS Impression: Technically successful ultrasound guided gadolinium contrast injection for MR arthrography.  Please see separate MR arthrogram report.   Independent interpretation of notes and tests performed by another provider:   None.  Brief History, Exam, Impression, and Recommendations:    Chronic right hip pain Long history of right groin pain with mechanical symptoms, seen by Dr. Denyse Amass and referred to me for injection for hip MR arthrogram. Return as needed.    ___________________________________________ Ihor Austin. Benjamin Stain, M.D., ABFM., CAQSM. Primary Care and Sports Medicine [facility]  Adjunct Instructor of Family Medicine  [employer]

## 2020-07-04 ENCOUNTER — Telehealth: Payer: Self-pay | Admitting: Family Medicine

## 2020-07-04 DIAGNOSIS — S72001A Fracture of unspecified part of neck of right femur, initial encounter for closed fracture: Secondary | ICD-10-CM

## 2020-07-04 DIAGNOSIS — M25551 Pain in right hip: Secondary | ICD-10-CM

## 2020-07-04 MED ORDER — ESCITALOPRAM OXALATE 20 MG PO TABS
20.0000 mg | ORAL_TABLET | Freq: Every day | ORAL | 0 refills | Status: DC
Start: 2020-07-04 — End: 2021-01-17

## 2020-07-04 NOTE — Progress Notes (Signed)
MRI shows a femoral neck fracture.  You do not have a history of trauma so I think this is due to stress fracture.  This may require surgery.  I have already placed a referral to orthopedic surgery and I have called and left a decision on your answering machine.  If you do not get in with orthopedic surgery very soon I think you know should see each other soon.  Please stop putting weight on your right leg.  Use crutches.

## 2020-07-04 NOTE — Telephone Encounter (Signed)
Referring to orthopedic surgery 

## 2020-07-04 NOTE — Telephone Encounter (Signed)
Cameron Joyce called back.  Reviewed MRI findings.  Plan for nonweightbearing and referral to orthopedic surgery to discuss surgical options.

## 2020-07-05 ENCOUNTER — Ambulatory Visit (INDEPENDENT_AMBULATORY_CARE_PROVIDER_SITE_OTHER): Payer: BC Managed Care – PPO

## 2020-07-05 DIAGNOSIS — J309 Allergic rhinitis, unspecified: Secondary | ICD-10-CM

## 2020-07-07 ENCOUNTER — Ambulatory Visit: Payer: BC Managed Care – PPO | Admitting: Orthopaedic Surgery

## 2020-07-07 ENCOUNTER — Encounter: Payer: Self-pay | Admitting: Orthopaedic Surgery

## 2020-07-07 DIAGNOSIS — M84351A Stress fracture, right femur, initial encounter for fracture: Secondary | ICD-10-CM

## 2020-07-07 MED ORDER — CALCIUM CARBONATE-VITAMIN D 500-200 MG-UNIT PO TABS: 1.0000 | ORAL_TABLET | Freq: Three times a day (TID) | ORAL | 6 refills | Status: DC

## 2020-07-07 NOTE — Progress Notes (Signed)
Office Visit Note   Patient: Cameron Joyce           Date of Birth: 21-Sep-1975           MRN: [id number] Visit Date: 07/07/2020              Requested by: Cameron Bong, MD [address] [place],  [place] [address] PCP: Cameron Canavan, PA-C   Assessment & Plan: Visit Diagnoses:  1. Stress fracture of neck of right femur     Plan: MRI of the right hip shows a incomplete stress fracture with a clear fracture line on the medial side of the femoral neck.  These images were reviewed with the patient in detail.  Given that the fracture is located on the compression side and incomplete this should be amenable to nonoperative treatment with nonweightbearing and crutches starting out for at least a month.  We will check a vitamin D and calcium level today.  Prescription for Os-Cal was provided.  Form for handicap was provided as well.  Recheck in 4 weeks.  He will need AP and lateral right hip x-rays on return.  Follow-Up Instructions: Return in about 4 weeks (around 08/04/2020).   Orders:  Orders Placed This Encounter  Procedures  . Vitamin D (25 hydroxy)  . Calcium   Meds ordered this encounter  Medications  . calcium-vitamin D (OSCAL WITH D) 500-200 MG-UNIT tablet    Sig: Take 1 tablet by mouth 3 (three) times daily.    Dispense:  90 tablet    Refill:  6      Procedures: No procedures performed   Clinical Data: No additional findings.   Subjective: Chief Complaint  Patient presents with  . Right Hip - Pain    Cameron Joyce is a 44 year old healthy male referral from Dr. Denyse Joyce for stress fracture of the right femoral neck that was recently found on MRI.  He started having pain about 8 weeks ago.  He was doing the [facility] workouts which required a fair amount of running.  Originally this was treated as a hip flexor strain.  He failed to improve with activity modifications and pain medications and so an MRI was done which showed a stress fracture of the femoral neck.  He has pain  in the hip and groin region.  No other symptoms.  He has been ambulating with a single-point cane in the right hand.  He reports no significant pain.   Review of Systems  Constitutional: Negative.   All other systems reviewed and are negative.    Objective: Vital Signs: There were no vitals taken for this visit.  Physical Exam Vitals and nursing note reviewed.  Constitutional:      Appearance: He is well-developed.  HENT:     Head: Normocephalic and atraumatic.  Eyes:     Pupils: Pupils are equal, round, and reactive to light.  Pulmonary:     Effort: Pulmonary effort is normal.  Abdominal:     Palpations: Abdomen is soft.  Musculoskeletal:        General: Normal range of motion.     Cervical back: Neck supple.  Skin:    General: Skin is warm.  Neurological:     Mental Status: He is alert and oriented to person, place, and time.  Psychiatric:        Behavior: Behavior normal.        Thought Content: Thought content normal.        Judgment: Judgment normal.  Ortho Exam  Right hip shows reproducible pain with external rotation.  No sciatic tension signs.  No tenderness palpation.  Pain with straight leg raise against gravity.    Specialty Comments:  No specialty comments available.  Imaging: No results found.   PMFS History: Patient Active Problem List   Diagnosis Date Noted  . Chronic right hip pain 07/03/2020  . Encounter for health maintenance examination in adult 11/05/2019  . Gastroesophageal reflux disease 11/05/2019  . Hiatal hernia 11/05/2019  . Allergic rhinitis due to pollen 11/05/2019  . Hyperlipidemia 11/05/2019  . Work stress 11/05/2019  . Vaccine counseling 11/05/2019  . Seasonal and perennial allergic rhinitis 11/01/2019  . Allergic conjunctivitis 11/01/2019   Past Medical History:  Diagnosis Date  . Allergy   . Anxiety   . Hyperlipidemia     Family History  Problem Relation Age of Onset  . Healthy Mother   . Diabetes Father   .  Healthy Brother   . Colon polyps Brother   . Arthritis Maternal Grandmother   . Lung cancer Paternal Grandmother   . Heart disease Paternal Grandfather   . Healthy Brother     Past Surgical History:  Procedure Laterality Date  . COLONOSCOPY  01/2017  . ESOPHAGOGASTRODUODENOSCOPY  01/2017   hiatal hernia  . NASAL SEPTUM SURGERY  2018  . SEPTOPLASTY     & turbinate reduction   Social History   Occupational History  . Not on file  Tobacco Use  . Smoking status: Never Smoker  . Smokeless tobacco: Never Used  Vaping Use  . Vaping Use: Never used  Substance and Sexual Activity  . Alcohol use: Yes    Comment: occasional   . Drug use: Never  . Sexual activity: Not on file

## 2020-07-08 LAB — VITAMIN D 25 HYDROXY (VIT D DEFICIENCY, FRACTURES): Vit D, 25-Hydroxy: 23 ng/mL — ABNORMAL LOW (ref 30–100)

## 2020-07-08 LAB — EXTRA LAV TOP TUBE

## 2020-07-08 LAB — CALCIUM: Calcium: 9.7 mg/dL (ref 8.6–10.3)

## 2020-07-17 ENCOUNTER — Ambulatory Visit (INDEPENDENT_AMBULATORY_CARE_PROVIDER_SITE_OTHER): Payer: BC Managed Care – PPO | Admitting: *Deleted

## 2020-07-17 DIAGNOSIS — J309 Allergic rhinitis, unspecified: Secondary | ICD-10-CM

## 2020-07-26 ENCOUNTER — Ambulatory Visit (INDEPENDENT_AMBULATORY_CARE_PROVIDER_SITE_OTHER): Payer: BC Managed Care – PPO | Admitting: *Deleted

## 2020-07-26 DIAGNOSIS — J309 Allergic rhinitis, unspecified: Secondary | ICD-10-CM

## 2020-07-31 ENCOUNTER — Encounter: Payer: Self-pay | Admitting: Orthopaedic Surgery

## 2020-08-01 ENCOUNTER — Ambulatory Visit (INDEPENDENT_AMBULATORY_CARE_PROVIDER_SITE_OTHER): Payer: BC Managed Care – PPO | Admitting: *Deleted

## 2020-08-01 DIAGNOSIS — J309 Allergic rhinitis, unspecified: Secondary | ICD-10-CM | POA: Diagnosis not present

## 2020-08-08 ENCOUNTER — Ambulatory Visit: Payer: BC Managed Care – PPO | Admitting: Orthopaedic Surgery

## 2020-08-08 ENCOUNTER — Encounter: Payer: Self-pay | Admitting: Orthopaedic Surgery

## 2020-08-08 ENCOUNTER — Ambulatory Visit: Payer: Self-pay

## 2020-08-08 VITALS — Ht 73.0 in | Wt 196.0 lb

## 2020-08-08 DIAGNOSIS — M84351A Stress fracture, right femur, initial encounter for fracture: Secondary | ICD-10-CM

## 2020-08-08 MED ORDER — VITAMIN D (ERGOCALCIFEROL) 1.25 MG (50000 UNIT) PO CAPS: 50000.0000 [IU] | ORAL_CAPSULE | ORAL | 6 refills | Status: DC

## 2020-08-08 NOTE — Progress Notes (Signed)
Post-Op Visit Note   Patient: Cameron Joyce           Date of Birth: May 13, 1976           MRN: [id number] Visit Date: 08/08/2020 PCP: Jac Canavan, PA-C   Assessment & Plan:  Chief Complaint:  Chief Complaint  Patient presents with  . Right Hip - Follow-up   Visit Diagnoses:  1. Stress fracture of neck of right femur     Plan:   Lark returns today for follow-up of right femoral neck stress fracture.  He is doing much better.  He rarely takes ibuprofen and has been over a week since he took anything.  He has been walking with a single crutch or cane at home without any pain.  He has been taking Os-Cal.  Right hip shows full range of motion without pain.  He is able to weight-bear without pain.  X-rays demonstrate healing of the fracture with consolidation and sclerosis.  At this point we will advance him to this single crutch or cane and then he can wean this as tolerated based on symptoms.  He knows to avoid any loadbearing activities for now.  I will switch him over to 50,000 units of vitamin D weekly because the Os-Cal was giving him constipation.  He has plans to see his PCP in March to recheck his vitamin D levels.  I would like to recheck him in 6 weeks with two-view x-rays of the right hip.  Follow-Up Instructions: Return in about 6 weeks (around 09/19/2020).   Orders:  Orders Placed This Encounter  Procedures  . XR HIP UNILAT W OR W/O PELVIS 2-3 VIEWS RIGHT   Meds ordered this encounter  Medications  . Vitamin D, Ergocalciferol, (DRISDOL) 1.25 MG (50000 UNIT) CAPS capsule    Sig: Take 1 capsule (50,000 Units total) by mouth every 7 (seven) days.    Dispense:  7 capsule    Refill:  6    Imaging: XR HIP UNILAT W OR W/O PELVIS 2-3 VIEWS RIGHT  Result Date: 08/08/2020 There is evidence of sclerosis and fracture healing across the femoral neck   PMFS History: Patient Active Problem List   Diagnosis Date Noted  . Chronic right hip pain 07/03/2020  .  Encounter for health maintenance examination in adult 11/05/2019  . Gastroesophageal reflux disease 11/05/2019  . Hiatal hernia 11/05/2019  . Allergic rhinitis due to pollen 11/05/2019  . Hyperlipidemia 11/05/2019  . Work stress 11/05/2019  . Vaccine counseling 11/05/2019  . Seasonal and perennial allergic rhinitis 11/01/2019  . Allergic conjunctivitis 11/01/2019   Past Medical History:  Diagnosis Date  . Allergy   . Anxiety   . Hyperlipidemia     Family History  Problem Relation Age of Onset  . Healthy Mother   . Diabetes Father   . Healthy Brother   . Colon polyps Brother   . Arthritis Maternal Grandmother   . Lung cancer Paternal Grandmother   . Heart disease Paternal Grandfather   . Healthy Brother     Past Surgical History:  Procedure Laterality Date  . COLONOSCOPY  01/2017  . ESOPHAGOGASTRODUODENOSCOPY  01/2017   hiatal hernia  . NASAL SEPTUM SURGERY  2018  . SEPTOPLASTY     & turbinate reduction   Social History   Occupational History  . Not on file  Tobacco Use  . Smoking status: Never Smoker  . Smokeless tobacco: Never Used  Vaping Use  . Vaping Use: Never used  Substance  and Sexual Activity  . Alcohol use: Yes    Comment: occasional   . Drug use: Never  . Sexual activity: Not on file

## 2020-08-17 ENCOUNTER — Ambulatory Visit (INDEPENDENT_AMBULATORY_CARE_PROVIDER_SITE_OTHER): Payer: BC Managed Care – PPO | Admitting: *Deleted

## 2020-08-17 DIAGNOSIS — J309 Allergic rhinitis, unspecified: Secondary | ICD-10-CM | POA: Diagnosis not present

## 2020-08-21 ENCOUNTER — Ambulatory Visit (INDEPENDENT_AMBULATORY_CARE_PROVIDER_SITE_OTHER): Payer: BC Managed Care – PPO | Admitting: *Deleted

## 2020-08-21 DIAGNOSIS — J309 Allergic rhinitis, unspecified: Secondary | ICD-10-CM | POA: Diagnosis not present

## 2020-08-21 NOTE — Progress Notes (Signed)
VIALS EXP 08-21-21 

## 2020-08-22 DIAGNOSIS — J302 Other seasonal allergic rhinitis: Secondary | ICD-10-CM

## 2020-08-31 ENCOUNTER — Ambulatory Visit (INDEPENDENT_AMBULATORY_CARE_PROVIDER_SITE_OTHER): Payer: BC Managed Care – PPO | Admitting: *Deleted

## 2020-08-31 DIAGNOSIS — J309 Allergic rhinitis, unspecified: Secondary | ICD-10-CM

## 2020-09-07 ENCOUNTER — Ambulatory Visit (INDEPENDENT_AMBULATORY_CARE_PROVIDER_SITE_OTHER): Payer: BC Managed Care – PPO

## 2020-09-07 DIAGNOSIS — J309 Allergic rhinitis, unspecified: Secondary | ICD-10-CM | POA: Diagnosis not present

## 2020-09-11 ENCOUNTER — Ambulatory Visit (INDEPENDENT_AMBULATORY_CARE_PROVIDER_SITE_OTHER): Payer: BC Managed Care – PPO

## 2020-09-11 DIAGNOSIS — J309 Allergic rhinitis, unspecified: Secondary | ICD-10-CM

## 2020-09-19 ENCOUNTER — Telehealth: Payer: Self-pay | Admitting: Medical

## 2020-09-19 ENCOUNTER — Encounter: Payer: Self-pay | Admitting: Orthopaedic Surgery

## 2020-09-19 ENCOUNTER — Ambulatory Visit (INDEPENDENT_AMBULATORY_CARE_PROVIDER_SITE_OTHER): Payer: BC Managed Care – PPO | Admitting: Orthopaedic Surgery

## 2020-09-19 ENCOUNTER — Ambulatory Visit: Payer: Self-pay

## 2020-09-19 ENCOUNTER — Ambulatory Visit (INDEPENDENT_AMBULATORY_CARE_PROVIDER_SITE_OTHER): Payer: BC Managed Care – PPO

## 2020-09-19 ENCOUNTER — Other Ambulatory Visit: Payer: Self-pay

## 2020-09-19 DIAGNOSIS — J309 Allergic rhinitis, unspecified: Secondary | ICD-10-CM | POA: Diagnosis not present

## 2020-09-19 DIAGNOSIS — M84351A Stress fracture, right femur, initial encounter for fracture: Secondary | ICD-10-CM | POA: Diagnosis not present

## 2020-09-19 NOTE — Progress Notes (Signed)
    Patient: Cameron Joyce           Date of Birth: 1975-11-17           MRN: [id number] Visit Date: 09/19/2020 PCP: Cameron Canavan, PA-C   Assessment & Plan:  Chief Complaint:  Chief Complaint  Patient presents with  . Right Hip - Fracture, Follow-up   Visit Diagnoses:  1. Stress fracture of neck of right femur     Plan:   Cameron Joyce is approximately 29-month status post nondisplaced femoral neck stress fracture.  Overall doing well.  Occasionally has discomfort in the hip and groin area after prolonged walking.  He will use a cane at times.  He has mild start of stiffness and discomfort.  Not taking any medications.  Currently not exercising.  Right hip shows full range of motion without pain.  He is ambulating with a normal gait without pain or limp.  X-rays demonstrate a healed femoral neck stress fracture with bony consolidation.  At this point he may discontinue the cane.  He may begin nonweightbearing exercises such as swimming and stationary bike.  He would need to be able to be completely asymptomatic before he can attempt to run again.  Like to recheck him in 2 months with repeat two-view x-rays of the right hip.  He does have an appointment with his PCP in March to recheck vitamin D levels.  Follow-Up Instructions: Return in about 2 months (around 11/17/2020).   Orders:  Orders Placed This Encounter  Procedures  . XR HIP UNILAT W OR W/O PELVIS 2-3 VIEWS RIGHT   No orders of the defined types were placed in this encounter.   Imaging: XR HIP UNILAT W OR W/O PELVIS 2-3 VIEWS RIGHT  Result Date: 09/19/2020 Healed femoral neck fracture   PMFS History: Patient Active Problem List   Diagnosis Date Noted  . Chronic right hip pain 07/03/2020  . Encounter for health maintenance examination in adult 11/05/2019  . Gastroesophageal reflux disease 11/05/2019  . Hiatal hernia 11/05/2019  . Allergic rhinitis due to pollen 11/05/2019  . Hyperlipidemia 11/05/2019  . Work stress  11/05/2019  . Vaccine counseling 11/05/2019  . Seasonal and perennial allergic rhinitis 11/01/2019  . Allergic conjunctivitis 11/01/2019   Past Medical History:  Diagnosis Date  . Allergy   . Anxiety   . Hyperlipidemia     Family History  Problem Relation Age of Onset  . Healthy Mother   . Diabetes Father   . Healthy Brother   . Colon polyps Brother   . Arthritis Maternal Grandmother   . Lung cancer Paternal Grandmother   . Heart disease Paternal Grandfather   . Healthy Brother     Past Surgical History:  Procedure Laterality Date  . COLONOSCOPY  01/2017  . ESOPHAGOGASTRODUODENOSCOPY  01/2017   hiatal hernia  . NASAL SEPTUM SURGERY  2018  . SEPTOPLASTY     & turbinate reduction   Social History   Occupational History  . Not on file  Tobacco Use  . Smoking status: Never Smoker  . Smokeless tobacco: Never Used  Vaping Use  . Vaping Use: Never used  Substance and Sexual Activity  . Alcohol use: Yes    Comment: occasional   . Drug use: Never  . Sexual activity: Not on file

## 2020-09-19 NOTE — Telephone Encounter (Signed)
Get in for visit regarding low vit D per ortho  FYI message from ortho:  Cameron Kos, MD  Cameron, Kermit Balo, PA-C Hi Cameron Joyce,  [facility] you're well. I recently saw Cameron Joyce for a femoral neck stress fracture. We obtained calcium and vitamin D levels and unfortunately the vitamin D levels are low which likely contributed to the stress fracture. I have asked him to make an appointment with you so that he can be placed on replacement therapy And any work up that you may feel is indicated. I just wanted to reach out to you and let you know what is going on. Please let me know if you have any questions. Thank you.

## 2020-09-20 NOTE — Telephone Encounter (Signed)
Left pt a vm to call the office and schedule appt

## 2020-09-25 ENCOUNTER — Ambulatory Visit (INDEPENDENT_AMBULATORY_CARE_PROVIDER_SITE_OTHER): Payer: BC Managed Care – PPO | Admitting: *Deleted

## 2020-09-25 DIAGNOSIS — J309 Allergic rhinitis, unspecified: Secondary | ICD-10-CM | POA: Diagnosis not present

## 2020-10-02 ENCOUNTER — Ambulatory Visit (INDEPENDENT_AMBULATORY_CARE_PROVIDER_SITE_OTHER): Payer: BC Managed Care – PPO | Admitting: *Deleted

## 2020-10-02 DIAGNOSIS — J309 Allergic rhinitis, unspecified: Secondary | ICD-10-CM | POA: Diagnosis not present

## 2020-10-09 ENCOUNTER — Ambulatory Visit (INDEPENDENT_AMBULATORY_CARE_PROVIDER_SITE_OTHER): Payer: BC Managed Care – PPO

## 2020-10-09 DIAGNOSIS — J309 Allergic rhinitis, unspecified: Secondary | ICD-10-CM | POA: Diagnosis not present

## 2020-10-16 ENCOUNTER — Ambulatory Visit (INDEPENDENT_AMBULATORY_CARE_PROVIDER_SITE_OTHER): Payer: BC Managed Care – PPO | Admitting: *Deleted

## 2020-10-16 DIAGNOSIS — J309 Allergic rhinitis, unspecified: Secondary | ICD-10-CM

## 2020-10-23 ENCOUNTER — Ambulatory Visit (INDEPENDENT_AMBULATORY_CARE_PROVIDER_SITE_OTHER): Payer: BC Managed Care – PPO | Admitting: *Deleted

## 2020-10-23 DIAGNOSIS — J309 Allergic rhinitis, unspecified: Secondary | ICD-10-CM | POA: Diagnosis not present

## 2020-11-06 ENCOUNTER — Encounter: Payer: BC Managed Care – PPO | Admitting: Medical

## 2020-11-10 ENCOUNTER — Ambulatory Visit (INDEPENDENT_AMBULATORY_CARE_PROVIDER_SITE_OTHER): Payer: BC Managed Care – PPO | Admitting: *Deleted

## 2020-11-10 DIAGNOSIS — J309 Allergic rhinitis, unspecified: Secondary | ICD-10-CM

## 2020-11-17 ENCOUNTER — Ambulatory Visit (INDEPENDENT_AMBULATORY_CARE_PROVIDER_SITE_OTHER): Payer: BC Managed Care – PPO

## 2020-11-17 DIAGNOSIS — J309 Allergic rhinitis, unspecified: Secondary | ICD-10-CM | POA: Diagnosis not present

## 2020-11-22 ENCOUNTER — Ambulatory Visit (INDEPENDENT_AMBULATORY_CARE_PROVIDER_SITE_OTHER): Payer: BC Managed Care – PPO

## 2020-11-22 DIAGNOSIS — J309 Allergic rhinitis, unspecified: Secondary | ICD-10-CM

## 2020-11-27 ENCOUNTER — Ambulatory Visit (INDEPENDENT_AMBULATORY_CARE_PROVIDER_SITE_OTHER): Payer: BC Managed Care – PPO

## 2020-11-27 DIAGNOSIS — J309 Allergic rhinitis, unspecified: Secondary | ICD-10-CM

## 2020-11-28 ENCOUNTER — Ambulatory Visit: Payer: BC Managed Care – PPO | Admitting: Orthopaedic Surgery

## 2020-11-29 ENCOUNTER — Ambulatory Visit: Payer: BC Managed Care – PPO | Admitting: Orthopaedic Surgery

## 2020-12-05 ENCOUNTER — Ambulatory Visit: Payer: Self-pay

## 2020-12-05 ENCOUNTER — Ambulatory Visit: Payer: BC Managed Care – PPO | Admitting: Orthopaedic Surgery

## 2020-12-05 DIAGNOSIS — M84351A Stress fracture, right femur, initial encounter for fracture: Secondary | ICD-10-CM | POA: Diagnosis not present

## 2020-12-05 NOTE — Progress Notes (Signed)
Office Visit Note   Patient: Cameron Joyce           Date of Birth: April 02, 1976           MRN: [id number] Visit Date: 12/05/2020              Requested by: Jac Canavan, PA-C [address] [place],  [place] [address] PCP: Jac Canavan, PA-C   Assessment & Plan: Visit Diagnoses:  1. Stress fracture of neck of right femur     Plan: At this point he is ready to be released back to activity as tolerated.  He will use good judgment and increase as his symptoms and his strength allow.  I will see him back as needed.  He will let me know if there is any problems with the vitamin D level.  Follow-Up Instructions: Return if symptoms worsen or fail to improve.   Orders:  Orders Placed This Encounter  Procedures  . XR HIP UNILAT W OR W/O PELVIS 2-3 VIEWS RIGHT   No orders of the defined types were placed in this encounter.     Procedures: No procedures performed   Clinical Data: No additional findings.   Subjective: Chief Complaint  Patient presents with  . Right Hip - Follow-up, Fracture    Cameron Joyce follows up today for his right femoral neck stress fracture.  He is doing much better.  He has resumed his F3 workouts.  He is jogging up to a mile and a half during these exercises without any problems.  Not using a cane anymore.  He has no pain or problems.  He feels that he is back to normal.  He had to reschedule his PCP appointment for repeat vitamin D level.   Review of Systems   Objective: Vital Signs: There were no vitals taken for this visit.  Physical Exam  Ortho Exam Right hip exam is unremarkable.  Full range of motion without pain.  Normal ambulation. Specialty Comments:  No specialty comments available.  Imaging: XR HIP UNILAT W OR W/O PELVIS 2-3 VIEWS RIGHT  Result Date: 12/05/2020 Healed femoral neck stress fracture with consolidation.    PMFS History: Patient Active Problem List   Diagnosis Date Noted  . Chronic right hip pain  07/03/2020  . Encounter for health maintenance examination in adult 11/05/2019  . Gastroesophageal reflux disease 11/05/2019  . Hiatal hernia 11/05/2019  . Allergic rhinitis due to pollen 11/05/2019  . Hyperlipidemia 11/05/2019  . Work stress 11/05/2019  . Vaccine counseling 11/05/2019  . Seasonal and perennial allergic rhinitis 11/01/2019  . Allergic conjunctivitis 11/01/2019   Past Medical History:  Diagnosis Date  . Allergy   . Anxiety   . Hyperlipidemia     Family History  Problem Relation Age of Onset  . Healthy Mother   . Diabetes Father   . Healthy Brother   . Colon polyps Brother   . Arthritis Maternal Grandmother   . Lung cancer Paternal Grandmother   . Heart disease Paternal Grandfather   . Healthy Brother     Past Surgical History:  Procedure Laterality Date  . COLONOSCOPY  01/2017  . ESOPHAGOGASTRODUODENOSCOPY  01/2017   hiatal hernia  . NASAL SEPTUM SURGERY  2018  . SEPTOPLASTY     & turbinate reduction   Social History   Occupational History  . Not on file  Tobacco Use  . Smoking status: Never Smoker  . Smokeless tobacco: Never Used  Vaping Use  . Vaping Use: Never  used  Substance and Sexual Activity  . Alcohol use: Yes    Comment: occasional   . Drug use: Never  . Sexual activity: Not on file

## 2020-12-07 ENCOUNTER — Ambulatory Visit (INDEPENDENT_AMBULATORY_CARE_PROVIDER_SITE_OTHER): Payer: BC Managed Care – PPO | Admitting: *Deleted

## 2020-12-07 DIAGNOSIS — J309 Allergic rhinitis, unspecified: Secondary | ICD-10-CM | POA: Diagnosis not present

## 2020-12-12 ENCOUNTER — Ambulatory Visit (INDEPENDENT_AMBULATORY_CARE_PROVIDER_SITE_OTHER): Payer: BC Managed Care – PPO | Admitting: *Deleted

## 2020-12-12 DIAGNOSIS — J309 Allergic rhinitis, unspecified: Secondary | ICD-10-CM | POA: Diagnosis not present

## 2020-12-12 NOTE — Progress Notes (Signed)
VIALS EXP 12-12-21 

## 2020-12-13 DIAGNOSIS — J3089 Other allergic rhinitis: Secondary | ICD-10-CM | POA: Diagnosis not present

## 2020-12-21 ENCOUNTER — Ambulatory Visit (INDEPENDENT_AMBULATORY_CARE_PROVIDER_SITE_OTHER): Payer: BC Managed Care – PPO | Admitting: *Deleted

## 2020-12-21 DIAGNOSIS — J309 Allergic rhinitis, unspecified: Secondary | ICD-10-CM

## 2020-12-24 ENCOUNTER — Other Ambulatory Visit: Payer: Self-pay | Admitting: Medical

## 2020-12-27 ENCOUNTER — Ambulatory Visit (INDEPENDENT_AMBULATORY_CARE_PROVIDER_SITE_OTHER): Payer: BC Managed Care – PPO

## 2020-12-27 DIAGNOSIS — J309 Allergic rhinitis, unspecified: Secondary | ICD-10-CM | POA: Diagnosis not present

## 2021-01-01 ENCOUNTER — Encounter: Payer: Self-pay | Admitting: Medical

## 2021-01-08 ENCOUNTER — Ambulatory Visit (INDEPENDENT_AMBULATORY_CARE_PROVIDER_SITE_OTHER): Payer: BC Managed Care – PPO

## 2021-01-08 DIAGNOSIS — J309 Allergic rhinitis, unspecified: Secondary | ICD-10-CM | POA: Diagnosis not present

## 2021-01-09 ENCOUNTER — Encounter: Payer: Self-pay | Admitting: Medical

## 2021-01-16 ENCOUNTER — Ambulatory Visit (INDEPENDENT_AMBULATORY_CARE_PROVIDER_SITE_OTHER): Payer: BC Managed Care – PPO | Admitting: *Deleted

## 2021-01-16 DIAGNOSIS — J309 Allergic rhinitis, unspecified: Secondary | ICD-10-CM | POA: Diagnosis not present

## 2021-01-17 ENCOUNTER — Other Ambulatory Visit: Payer: Self-pay | Admitting: Medical

## 2021-01-17 MED ORDER — ESCITALOPRAM OXALATE 10 MG PO TABS
10.0000 mg | ORAL_TABLET | Freq: Every day | ORAL | 0 refills | Status: DC
Start: 2021-01-17 — End: 2021-02-08

## 2021-01-23 ENCOUNTER — Ambulatory Visit (INDEPENDENT_AMBULATORY_CARE_PROVIDER_SITE_OTHER): Payer: BC Managed Care – PPO | Admitting: *Deleted

## 2021-01-23 DIAGNOSIS — J309 Allergic rhinitis, unspecified: Secondary | ICD-10-CM | POA: Diagnosis not present

## 2021-01-27 NOTE — Patient Instructions (Addendum)
  Seasonal and perennial allergic rhinitis Continue immunotherapy per protocol and have access to epinephrine autoinjector device.Refill of Auvi Q 0.3 mg sent Continue Xhance using 2 sprays each nostril twice a day as needed for stuffy nose.   May use saline nasal spray or saline nasal rinse as needed for nasal symptoms.  Use this prior to any medicated nasal sprays May use Allegra once a day as needed for runny nose  Allergic conjunctivitis Continue Pataday using 1 drop each eye once a day as needed for itchy watery eyes  Please let us know if this treatment plan is not working well for you Schedule a follow-up appointment in 1 year or sooner if needed

## 2021-01-29 ENCOUNTER — Other Ambulatory Visit: Payer: Self-pay

## 2021-01-29 ENCOUNTER — Encounter: Payer: Self-pay | Admitting: Family

## 2021-01-29 ENCOUNTER — Ambulatory Visit: Payer: BC Managed Care – PPO | Admitting: Family

## 2021-01-29 VITALS — BP 124/80 | HR 70 | Temp 97.5°F | Resp 18 | Ht 73.0 in | Wt 193.0 lb

## 2021-01-29 DIAGNOSIS — H1013 Acute atopic conjunctivitis, bilateral: Secondary | ICD-10-CM

## 2021-01-29 DIAGNOSIS — J3089 Other allergic rhinitis: Secondary | ICD-10-CM

## 2021-01-29 MED ORDER — OLOPATADINE HCL 0.2 % OP SOLN: OPHTHALMIC | 5 refills | Status: AC

## 2021-01-29 MED ORDER — EPINEPHRINE 0.3 MG/0.3ML IJ SOAJ: 0.3000 mg | INTRAMUSCULAR | 1 refills | Status: DC | PRN

## 2021-01-29 NOTE — Progress Notes (Signed)
[address] Cameron Joyce Cameron Joyce [place] [address] Dept: [phone]  FOLLOW UP NOTE  Patient ID: Cameron Joyce, male    DOB: [birth year]  Age: 45 y.o. MRN: [id number] Date of Office Visit: 01/29/2021  Assessment  Chief Complaint: Allergic Rhinitis  (No symptoms or flares )  HPI Cameron Joyce is a 45 year old male who presents today for follow-up of seasonal and perennial allergic rhinitis and allergic conjunctivitis.  He was last seen on February 03, 2020 by Dr. Nunzio Cobbs.  He reports since we last saw him he was diagnosed with COVID-19 a month ago.  Seasonal and perennial allergic rhinitis is reported as controlled with Allegra once a day and immunotherapy injections per protocol.  He reports that he usually stops his Allegra in the off season and he has not needed to use Xhance nasal spray.  He does not really like nasal sprays ,but [name] is the better of the options.  He does feel like his allergy injections are helping.  He reports at his last injection his right arm swelled a little more than usual, but was down the next day.  Instructed him to make sure to let the injection room nurse know this information so we could dose down accordingly.  Allergic conjunctivitis is reported as controlled with Pataday eyedrops as needed.  He reports occasional itchy watery eyes for which Pataday eyedrops help.   Drug Allergies:  Allergies  Allergen Reactions   Grass Pollen(K-O-R-T-Swt Vern)    Pollen Extract     Review of Systems: Review of Systems  Constitutional:  Negative for chills and fever.  HENT:         Denies rhinorrhea, nasal congestion, and postnasal drip.  Eyes:        Reports occasional itchy watery eyes  Respiratory:  Negative for cough, shortness of breath and wheezing.   Cardiovascular:  Negative for chest pain and palpitations.  Gastrointestinal:  Negative for heartburn.       Reports that he has an upcoming appointment with his primary care physician to discuss his reflux  symptoms.  Reports he is currently on Prilosec for hiatal hernia and esophageal erosion still having symptoms.  Genitourinary:  Negative for dysuria.  Skin:  Negative for itching and rash.  Neurological:  Negative for headaches.  Endo/Heme/Allergies:  Positive for environmental allergies.    Physical Exam: BP 124/80   Pulse 70   Temp (!) 97.5 F (36.4 C)   Resp 18   Ht 6\' 1"  (1.854 m)   Wt 193 lb (87.5 kg)   SpO2 96%   BMI 25.46 kg/m    Physical Exam Constitutional:      Appearance: Normal appearance.  HENT:     Head: Normocephalic and atraumatic.     Comments: Pharynx normal, eyes normal, ears normal, nose: Slight erythema/abrasion noted in left nostril.  Right nostril normal    Right Ear: Tympanic membrane, ear canal and external ear normal.     Left Ear: Tympanic membrane, ear canal and external ear normal.     Mouth/Throat:     Mouth: Mucous membranes are moist.     Pharynx: Oropharynx is clear.  Eyes:     Conjunctiva/sclera: Conjunctivae normal.  Cardiovascular:     Rate and Rhythm: Normal rate and regular rhythm.     Heart sounds: Normal heart sounds.  Pulmonary:     Effort: Pulmonary effort is normal.     Breath sounds: Normal breath sounds.     Comments: Lungs clear to auscultation Musculoskeletal:  Cervical back: Neck supple.  Skin:    General: Skin is warm.  Neurological:     Mental Status: He is alert and oriented to person, place, and time.  Psychiatric:        Mood and Affect: Mood normal.        Behavior: Behavior normal.        Thought Content: Thought content normal.        Judgment: Judgment normal.    Diagnostics:  none  Assessment and Plan: 1. Seasonal and perennial allergic rhinitis   2. Allergic conjunctivitis of both eyes     No orders of the defined types were placed in this encounter.   Patient Instructions   Seasonal and perennial allergic rhinitis Continue immunotherapy per protocol and have access to epinephrine  autoinjector device.Refill of Auvi Q 0.3 mg sent Continue Xhance using 2 sprays each nostril twice a day as needed for stuffy nose.   May use saline nasal spray or saline nasal rinse as needed for nasal symptoms.  Use this prior to any medicated nasal sprays May use Allegra once a day as needed for runny nose  Allergic conjunctivitis Continue Pataday using 1 drop each eye once a day as needed for itchy watery eyes  Please let us know if this treatment plan is not working well for you Schedule a follow-up appointment in 1 year or sooner if needed Return in about 1 year (around 01/29/2022), or if symptoms worsen or fail to improve.    Thank you for the opportunity to care for this patient.  Please do not hesitate to contact me with questions.  Nehemiah Settle, FNP [facility]

## 2021-02-05 ENCOUNTER — Ambulatory Visit: Payer: BC Managed Care – PPO | Admitting: Allergy and Immunology

## 2021-02-07 ENCOUNTER — Ambulatory Visit (INDEPENDENT_AMBULATORY_CARE_PROVIDER_SITE_OTHER): Payer: BC Managed Care – PPO

## 2021-02-07 DIAGNOSIS — J309 Allergic rhinitis, unspecified: Secondary | ICD-10-CM

## 2021-02-08 ENCOUNTER — Encounter: Payer: Self-pay | Admitting: Medical

## 2021-02-08 ENCOUNTER — Ambulatory Visit: Payer: BC Managed Care – PPO | Admitting: Medical

## 2021-02-08 ENCOUNTER — Other Ambulatory Visit: Payer: Self-pay

## 2021-02-08 ENCOUNTER — Other Ambulatory Visit: Payer: Self-pay | Admitting: Medical

## 2021-02-08 VITALS — BP 120/78 | HR 67 | Ht 73.0 in | Wt 197.0 lb

## 2021-02-08 DIAGNOSIS — Z23 Encounter for immunization: Secondary | ICD-10-CM | POA: Diagnosis not present

## 2021-02-08 DIAGNOSIS — Z8781 Personal history of (healed) traumatic fracture: Secondary | ICD-10-CM

## 2021-02-08 DIAGNOSIS — Z7185 Encounter for immunization safety counseling: Secondary | ICD-10-CM | POA: Diagnosis not present

## 2021-02-08 DIAGNOSIS — Z0000 Encounter for general adult medical examination without abnormal findings: Secondary | ICD-10-CM

## 2021-02-08 DIAGNOSIS — J3089 Other allergic rhinitis: Secondary | ICD-10-CM

## 2021-02-08 DIAGNOSIS — E785 Hyperlipidemia, unspecified: Secondary | ICD-10-CM

## 2021-02-08 DIAGNOSIS — J301 Allergic rhinitis due to pollen: Secondary | ICD-10-CM

## 2021-02-08 DIAGNOSIS — K449 Diaphragmatic hernia without obstruction or gangrene: Secondary | ICD-10-CM

## 2021-02-08 DIAGNOSIS — H1013 Acute atopic conjunctivitis, bilateral: Secondary | ICD-10-CM

## 2021-02-08 DIAGNOSIS — E559 Vitamin D deficiency, unspecified: Secondary | ICD-10-CM

## 2021-02-08 DIAGNOSIS — K219 Gastro-esophageal reflux disease without esophagitis: Secondary | ICD-10-CM

## 2021-02-08 NOTE — Progress Notes (Signed)
Subjective:   HPI  Cameron Joyce is a 45 y.o. male who presents for Chief Complaint  Patient presents with   Annual Exam    Physical with fasting labs     Patient Care Team: Patriciann Becht, Cleda Mccreedy as PCP - General (Family Medicine) Sees dentist, Dr. Earlie Lou Dr. Gershon Mussel, orthopedics Dr. Nehemiah Settle, allergist  Concerns: Since last visit here had hip fracture, been seeing orhto.  They were concerned about vit D . He is on supplement, wants this checked today maternal grandmohter had hx/o osteoporosis  He takes prilosec for prior esophageal erosions, history bad refluex and hiatal hernia.    He was on statin after last visit here but never came back for f/u and ran out of meidcaiotn.   He was on medication in the past as well.  Reportedly could be genetic related.  No other cardiac risk factors.    Put on Lexapro several years ago for anger and outbursts related to stress.   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   Since moving to [place], much less stress at [employer] band program.     Reviewed their medical, surgical, family, social, medication, and allergy history and updated chart as appropriate.  Past Medical History:  Diagnosis Date   Allergy    Anxiety    Hyperlipidemia     Past Surgical History:  Procedure Laterality Date   COLONOSCOPY  01/2017   ESOPHAGOGASTRODUODENOSCOPY  01/2017   hiatal hernia   NASAL SEPTUM SURGERY  2018   SEPTOPLASTY     & turbinate reduction    Social History   Socioeconomic History   Marital status: Married    Spouse name: Not on file   Number of children: Not on file   Years of education: Not on file   Highest education level: Not on file  Occupational History   Not on file  Tobacco Use   Smoking status: Never   Smokeless tobacco: Never  Vaping Use   Vaping Use: Never used  Substance and Sexual Activity   Alcohol use: Yes     Alcohol/week: 3.0 standard drinks    Types: 3 Shots of liquor per week   Drug use: Never   Sexual activity: Not on file  Other Topics Concern   Not on file  Social History Narrative   Married, wife in PhD music program Haroldine Laws, he teaches music and band at [employer], exercise most days per week, Ephriam Knuckles, has son in school of arts.   01/2021   Social Determinants of Health   Financial Resource Strain: Not on file  Food Insecurity: Not on file  Transportation Needs: Not on file  Physical Activity: Not on file  Stress: Not on file  Social Connections: Not on file  Intimate Partner Violence: Not on file    Family History  Problem Relation Age of Onset   Healthy Mother    Diabetes Father    Healthy Brother    Colon polyps Brother    Healthy Brother    Arthritis Maternal Grandmother    Diabetes Paternal Grandmother    Lung cancer Paternal Grandmother    Diabetes Paternal Grandfather    Heart disease Paternal Grandfather      Current Outpatient Medications:    ALPRAZolam (XANAX) 0.5 MG tablet, Take 1 tablet (0.5 mg total) by mouth at bedtime as needed for anxiety., Disp: 30 tablet,  Rfl: 0   escitalopram (LEXAPRO) 10 MG tablet, Take 1 tablet (10 mg total) by mouth daily., Disp: 30 tablet, Rfl: 0   Olopatadine HCl (PATADAY) 0.2 % SOLN, Place 1 drop in each eye once a day as needed for itchy watery eyes, Disp: 2.5 mL, Rfl: 5   omeprazole (PRILOSEC) 20 MG capsule, TAKE 1 CAPSULE BY MOUTH EVERY DAY IN THE MORNING, Disp: 90 capsule, Rfl: 0   Vitamin D, Ergocalciferol, (DRISDOL) 1.25 MG (50000 UNIT) CAPS capsule, Take 1 capsule (50,000 Units total) by mouth every 7 (seven) days., Disp: 7 capsule, Rfl: 6   atorvastatin (LIPITOR) 10 MG tablet, Take 10 mg by mouth daily. (Patient not taking: No sig reported), Disp: , Rfl:    Fluticasone Propionate (XHANCE) 93 MCG/ACT EXHU, Place 2 sprays into the nose 2 (two) times daily as needed. (Patient not taking: No sig reported), Disp:  32 mL, Rfl: 5  Allergies  Allergen Reactions   Grass Pollen(K-O-R-T-Swt Vern)    Pollen Extract      Review of Systems Constitutional: -fever, -chills, -sweats, -unexpected weight change, -decreased appetite, -fatigue Allergy: -sneezing, -itching, -congestion Dermatology: -changing moles, --rash, -lumps ENT: -runny nose, -ear pain, -sore throat, -hoarseness, -sinus pain, -teeth pain, - ringing in ears, -hearing loss, -nosebleeds Cardiology: -chest pain, -palpitations, -swelling, -difficulty breathing when lying flat, -waking up short of breath Respiratory: -cough, -shortness of breath, -difficulty breathing with exercise or exertion, -wheezing, -coughing up blood Gastroenterology: -abdominal pain, -nausea, -vomiting, -diarrhea, -constipation, -blood in stool, -changes in bowel movement, -difficulty swallowing or eating Hematology: -bleeding, -bruising  Musculoskeletal: -joint aches, -muscle aches, -joint swelling, -back pain, -neck pain, -cramping, -changes in gait Ophthalmology: denies vision changes, eye redness, itching, discharge Urology: -burning with urination, -difficulty urinating, -blood in urine, -urinary frequency, -urgency, -incontinence Neurology: -headache, -weakness, -tingling, -numbness, -memory loss, -falls, -dizziness Psychology: -depressed mood, -agitation, -sleep problems Male GU: no testicular mass, pain, no lymph nodes swollen, no swelling, no rash.     Objective:  BP 120/78   Pulse 67   Ht 6\' 1"  (1.854 m)   Wt 197 lb (89.4 kg)   SpO2 98%   BMI 25.99 kg/m   General appearance: alert, no distress, WD/WN, Caucasian male Skin: few scattered lesions, left upper back with 2 elliptical scars from prior biopsy Neck: supple, no lymphadenopathy, no thyromegaly, no masses, normal ROM, no bruits Chest: non tender, normal shape and expansion Heart: RRR, normal S1, S2, no murmurs Lungs: CTA bilaterally, no wheezes, rhonchi, or rales Abdomen: +bs, soft, non tender,  non distended, no masses, no hepatomegaly, no splenomegaly, no bruits Back: non tender, normal ROM, no scoliosis Musculoskeletal: upper extremities non tender, no obvious deformity, normal ROM throughout, lower extremities non tender, no obvious deformity, normal ROM throughout Extremities: no edema, no cyanosis, no clubbing Pulses: 2+ symmetric, upper and lower extremities, normal cap refill Neurological: alert, oriented x 3, CN2-12 intact, strength normal upper extremities and lower extremities, sensation normal throughout, DTRs 2+ throughout, no cerebellar signs, gait normal Psychiatric: normal affect, behavior normal, pleasant  GU: normal male external genitalia,circumcised, nontender, no masses, no hernia, no lymphadenopathy Rectal: deferred   Assessment and Plan :   Encounter Diagnoses  Name Primary?   Encounter for health maintenance examination in adult Yes   Need for Tdap vaccination    Vaccine counseling    Seasonal and perennial allergic rhinitis    Hyperlipidemia, unspecified hyperlipidemia type    Gastroesophageal reflux disease, unspecified whether esophagitis present    Hiatal hernia  Allergic conjunctivitis of both eyes    Allergic rhinitis due to pollen, unspecified seasonality    History of hip fracture    Vitamin D deficiency      Physical exam - discussed and counseled on healthy lifestyle, diet, exercise, preventative care, vaccinations, sick and well care, proper use of emergency dept and after hours care, and addressed their concerns.    Health screening: See your eye doctor yearly for routine vision care. See your dentist yearly for routine dental care including hygiene visits twice yearly.  Cancer screening Advised monthly self testicular exam  Referral to GI for updated EGD and possibly colonoscpy  PSA age 65yo   Vaccinations: Advised yearly influenza vaccine  Counseled on the Tdap (tetanus, diptheria, and acellular pertussis) vaccine.   Vaccine information sheet given. Tdap vaccine given after consent obtained.  Immunization History  Administered Date(s) Administered   Influenza-Unspecified 04/20/2015, 05/09/2020   PFIZER(Purple Top)SARS-COV-2 Vaccination 10/16/2019, 11/06/2019, 05/25/2020   Tdap 02/08/2021     Separate significant chronic issues discussed: Hyperlipidemia-on medication prior.  He wanted a fasting baseline without medication today.  Vitamin D deficiency noted recently a few months ago when he had a hip fracture.  He has been seeing orthopedics.  He is compliant with vitamin D.  Recheck labs today  GERD, hiatal hernia-discussed the risk and benefits of medication including long-term risk of osteoporosis with PPI medication.  He continues on 20 mg Prilosec.  Referral back to GI  History of hip fracture-resolved, was being managed by orthopedics   Brenton was seen today for annual exam.  Diagnoses and all orders for this visit:  Encounter for health maintenance examination in adult -     VITAMIN D 25 Hydroxy (Vit-D Deficiency, Fractures) -     Comprehensive metabolic panel -     CBC -     Lipid panel  Need for Tdap vaccination  Vaccine counseling  Seasonal and perennial allergic rhinitis  Hyperlipidemia, unspecified hyperlipidemia type -     Lipid panel  Gastroesophageal reflux disease, unspecified whether esophagitis present -     Ambulatory referral to Gastroenterology  Hiatal hernia  Allergic conjunctivitis of both eyes  Allergic rhinitis due to pollen, unspecified seasonality  History of hip fracture  Vitamin D deficiency -     VITAMIN D 25 Hydroxy (Vit-D Deficiency, Fractures)  Other orders -     Tdap vaccine greater than or equal to 7yo IM   Follow-up pending labs, yearly for physical

## 2021-02-09 ENCOUNTER — Other Ambulatory Visit: Payer: Self-pay | Admitting: Medical

## 2021-02-09 ENCOUNTER — Encounter: Payer: Self-pay | Admitting: Orthopaedic Surgery

## 2021-02-09 ENCOUNTER — Other Ambulatory Visit: Payer: Self-pay

## 2021-02-09 DIAGNOSIS — Z136 Encounter for screening for cardiovascular disorders: Secondary | ICD-10-CM

## 2021-02-09 LAB — LIPID PANEL
Chol/HDL Ratio: 4.7 ratio (ref 0.0–5.0)
Cholesterol, Total: 264 mg/dL — ABNORMAL HIGH (ref 100–199)
HDL: 56 mg/dL (ref >39)
LDL Chol Calc (NIH): 173 mg/dL — ABNORMAL HIGH (ref 0–99)
Triglycerides: 188 mg/dL — ABNORMAL HIGH (ref 0–149)
VLDL Cholesterol Cal: 35 mg/dL (ref 5–40)

## 2021-02-09 LAB — CBC
Hematocrit: 49 % (ref 37.5–51.0)
Hemoglobin: 16.1 g/dL (ref 13.0–17.7)
MCH: 30.8 pg (ref 26.6–33.0)
MCHC: 32.9 g/dL (ref 31.5–35.7)
MCV: 94 fL (ref 79–97)
Platelets: 241 10*3/uL (ref 150–450)
RBC: 5.23 x10E6/uL (ref 4.14–5.80)
RDW: 12.6 % (ref 11.6–15.4)
WBC: 6.8 10*3/uL (ref 3.4–10.8)

## 2021-02-09 LAB — COMPREHENSIVE METABOLIC PANEL
ALT: 34 IU/L (ref 0–44)
AST: 40 IU/L (ref 0–40)
Albumin/Globulin Ratio: 1.9 (ref 1.2–2.2)
Albumin: 4.6 g/dL (ref 4.0–5.0)
Alkaline Phosphatase: 92 IU/L (ref 44–121)
BUN/Creatinine Ratio: 14 (ref 9–20)
BUN: 13 mg/dL (ref 6–24)
Bilirubin Total: 0.9 mg/dL (ref 0.0–1.2)
CO2: 23 mmol/L (ref 20–29)
Calcium: 9.6 mg/dL (ref 8.7–10.2)
Chloride: 100 mmol/L (ref 96–106)
Creatinine, Ser: 0.91 mg/dL (ref 0.76–1.27)
Globulin, Total: 2.4 g/dL (ref 1.5–4.5)
Glucose: 104 mg/dL — ABNORMAL HIGH (ref 65–99)
Potassium: 4.4 mmol/L (ref 3.5–5.2)
Sodium: 138 mmol/L (ref 134–144)
Total Protein: 7 g/dL (ref 6.0–8.5)
eGFR: 106 mL/min/{1.73_m2} (ref >59)

## 2021-02-09 LAB — VITAMIN D 25 HYDROXY (VIT D DEFICIENCY, FRACTURES): Vit D, 25-Hydroxy: 43.6 ng/mL (ref 30.0–100.0)

## 2021-02-09 MED ORDER — ATORVASTATIN CALCIUM 10 MG PO TABS: 10.0000 mg | ORAL_TABLET | Freq: Every day | ORAL | 3 refills | Status: AC

## 2021-02-09 MED ORDER — VITAMIN D (ERGOCALCIFEROL) 1.25 MG (50000 UNIT) PO CAPS: 50000.0000 [IU] | ORAL_CAPSULE | ORAL | 3 refills | Status: AC

## 2021-03-06 ENCOUNTER — Ambulatory Visit (INDEPENDENT_AMBULATORY_CARE_PROVIDER_SITE_OTHER): Payer: BC Managed Care – PPO

## 2021-03-06 DIAGNOSIS — J309 Allergic rhinitis, unspecified: Secondary | ICD-10-CM | POA: Diagnosis not present

## 2021-03-20 ENCOUNTER — Ambulatory Visit (INDEPENDENT_AMBULATORY_CARE_PROVIDER_SITE_OTHER): Payer: BC Managed Care – PPO

## 2021-03-20 DIAGNOSIS — J309 Allergic rhinitis, unspecified: Secondary | ICD-10-CM

## 2021-03-26 ENCOUNTER — Ambulatory Visit
Admission: RE | Admit: 2021-03-26 | Discharge: 2021-03-26 | Disposition: A | Discharge status: Home / Self Care | Payer: No Typology Code available for payment source | Source: Ambulatory Visit | Attending: Medical | Admitting: Medical

## 2021-03-26 ENCOUNTER — Other Ambulatory Visit: Payer: Self-pay

## 2021-03-26 DIAGNOSIS — Z136 Encounter for screening for cardiovascular disorders: Secondary | ICD-10-CM

## 2021-03-26 IMAGING — CT CT CARDIAC CORONARY ARTERY CALCIUM SCORE
3 series · 13 of 20 positions shown, 15 images · non-contrast
Comparison: None.

CLINICAL DATA: 45-year-old white male with screening for heart
disease. Hyperlipidemia.

EXAM:
CT CARDIAC CORONARY ARTERY CALCIUM SCORE
TECHNIQUE: Non-contrast imaging through the heart was performed using
prospective ECG gating. Image post processing was performed on an
independent workstation, allowing for quantitative analysis of the
heart and coronary arteries. Note that this exam targets the heart
and the chest was not imaged in its entirety.

[Series 2: calcium scoring 2.00 qr36 bestdiast 68% hrt calciu · axial · 0.37mm/px · z∈[+1644,+1708]mm · 3 of 80 slices shown]
[im 16/80  vessel]
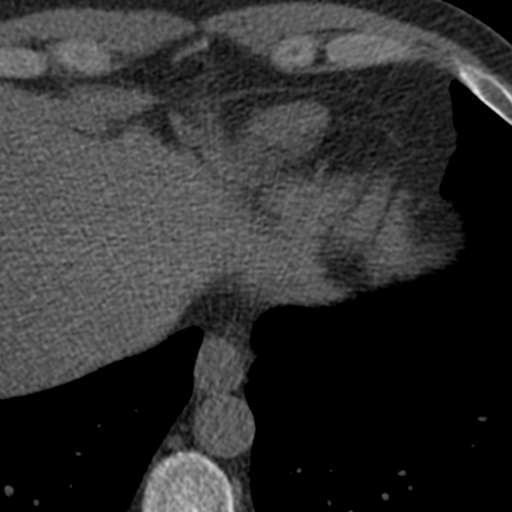
[im 32/80  vessel]
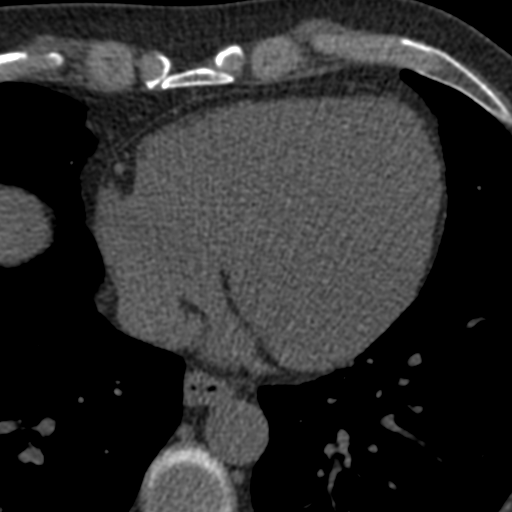
[im 48/80  vessel]
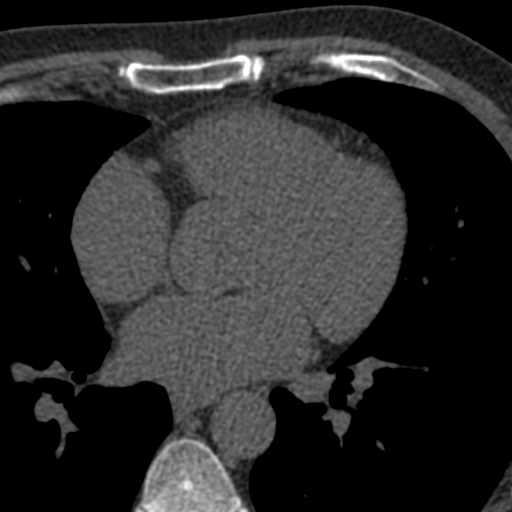

[Series 3: calcium scoring 2.00 br40 bestdiast 68% axial · axial · 0.51mm/px · z∈[+1640,+1744]mm · 5 of 80 slices shown, 7 images]
[im 14/80  vessel]
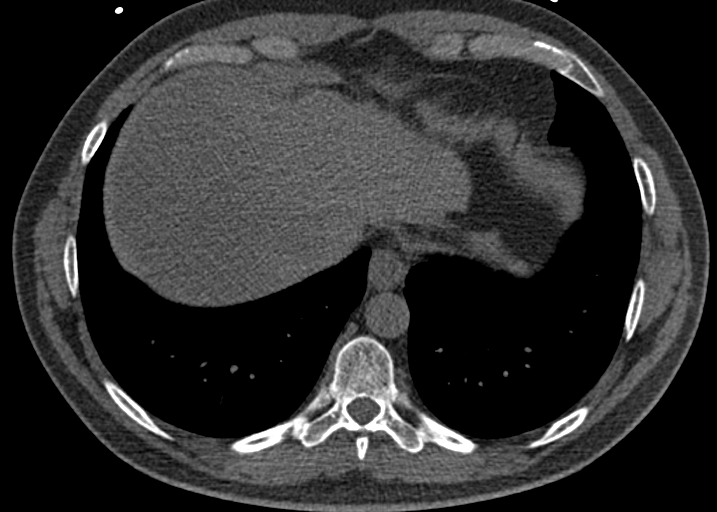
[im 14/80  lung]
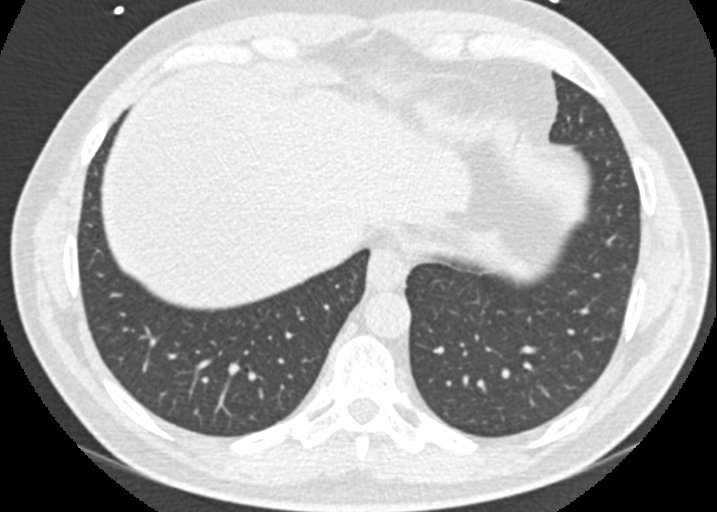
[im 27/80  vessel]
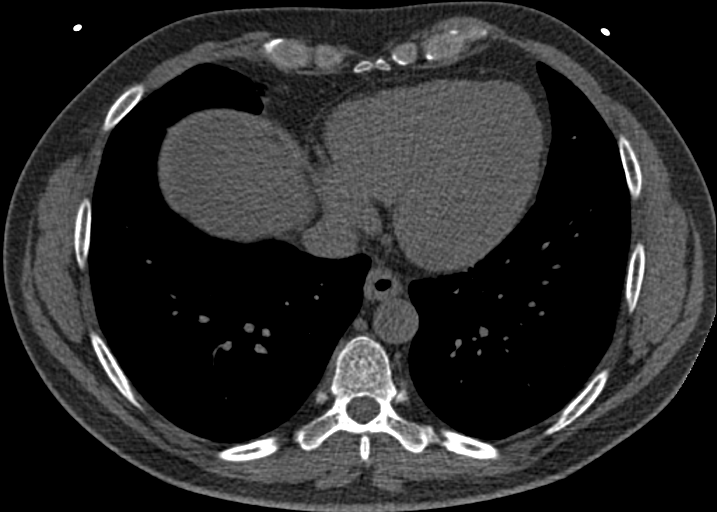
[im 40/80  vessel]
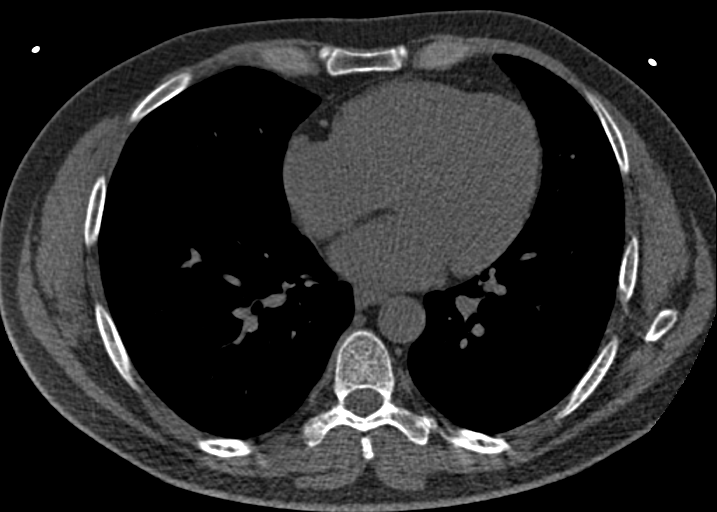
[im 53/80  vessel]
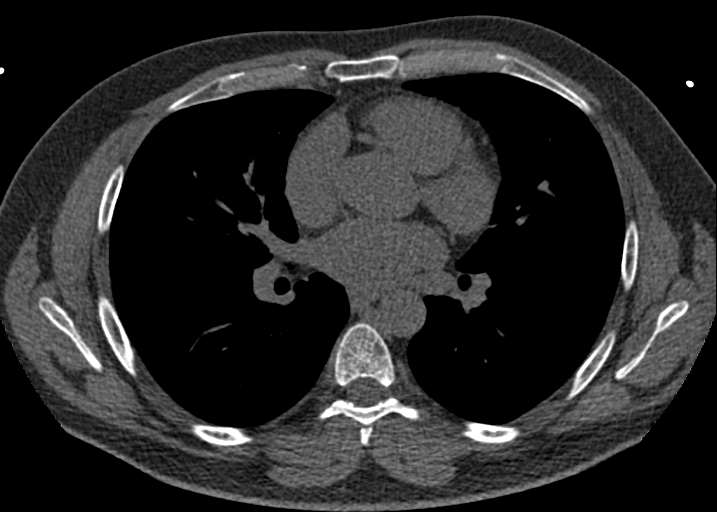
[im 66/80  vessel]
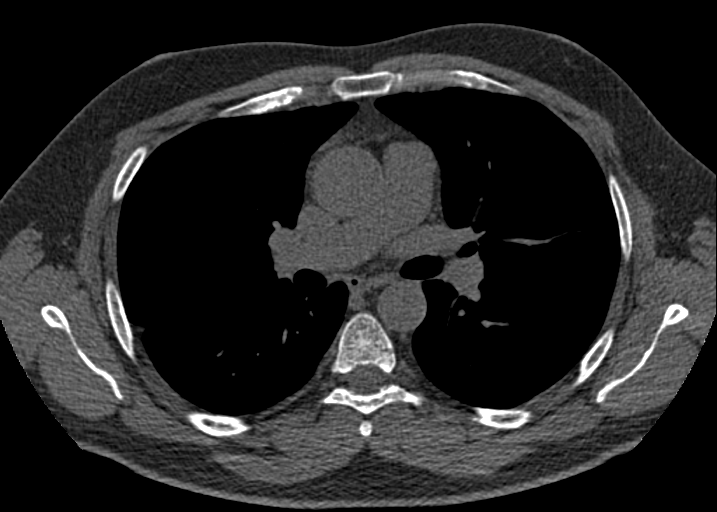
[im 66/80  lung]
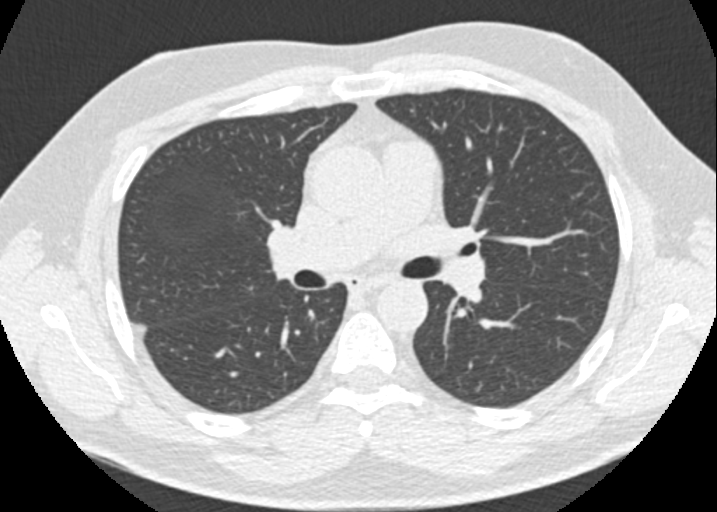

[Series 9: calcium scoring 2.00 br60 bestdiast 68% lungs · axial · 0.51mm/px · z∈[+1640,+1744]mm · 5 of 80 slices shown]
[im 14/80  vessel]
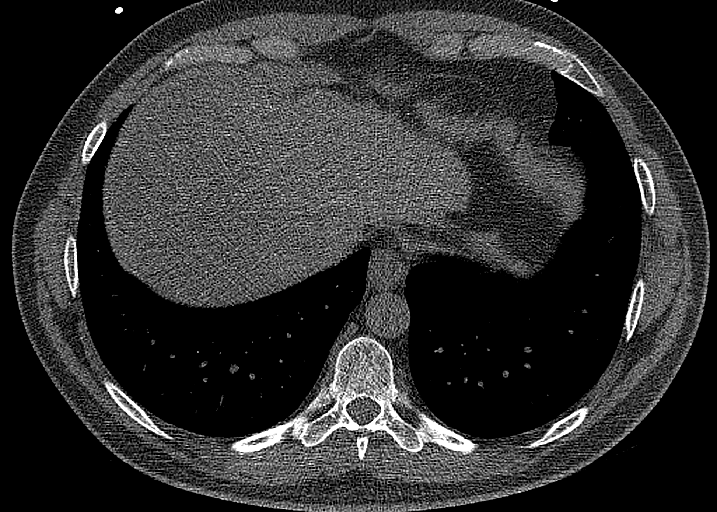
[im 27/80  vessel]
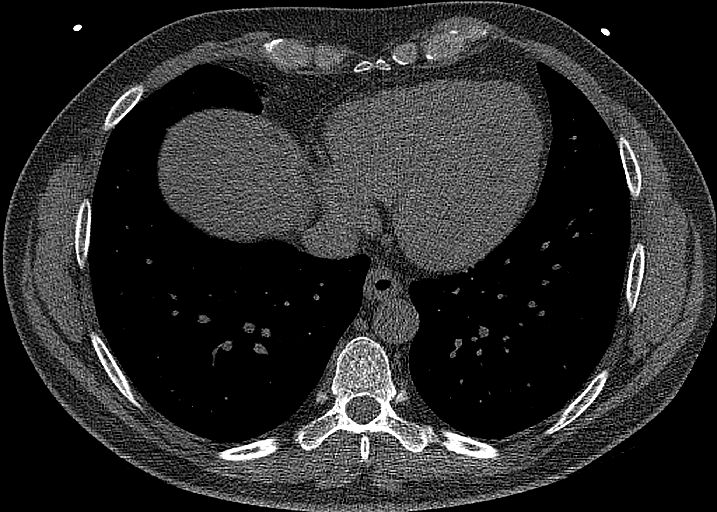
[im 40/80  vessel]
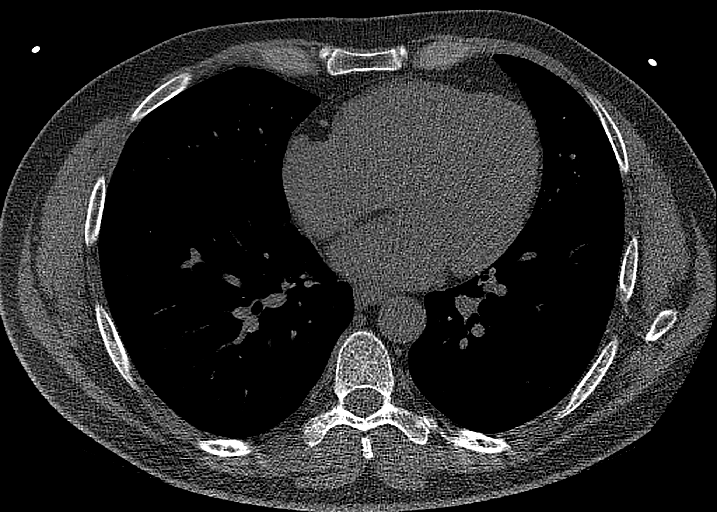
[im 53/80  vessel]
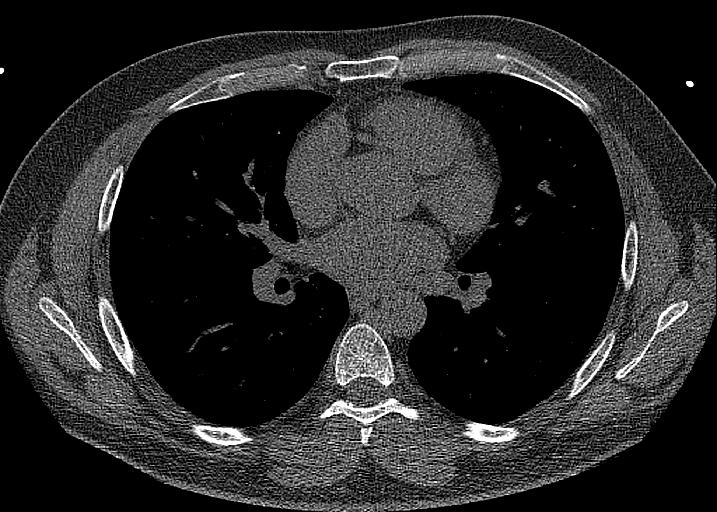
[im 66/80  vessel]
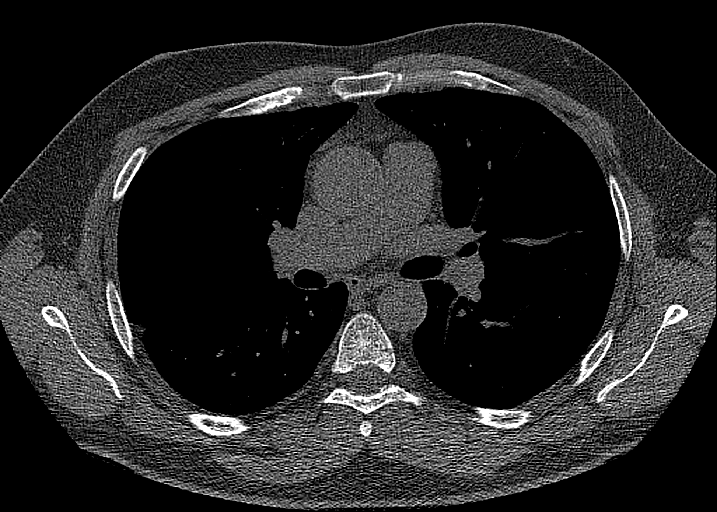

[13 of 20 positions shown; findings below may reference images not displayed]

FINDINGS: CORONARY CALCIUM SCORES:

Left Main: 0

LAD: 136

LCx: 0

RCA: 0

Total Agatston Score: 136

[HOSPITAL] percentile: 96

AORTA MEASUREMENTS:

Ascending Aorta: 37 mm

Descending Aorta: 28 mm

OTHER FINDINGS:

Visualized mediastinal structures are normal. Images of the upper
abdomen are unremarkable. Visualized lungs are clear. No pleural
effusions. No acute bone abnormality.
IMPRESSION: Coronary calcium score is 136 and this is at percentile 96 for
patients of the same age, gender and ethnicity.

## 2021-03-30 ENCOUNTER — Other Ambulatory Visit: Payer: Self-pay | Admitting: Medical

## 2021-04-02 ENCOUNTER — Ambulatory Visit (INDEPENDENT_AMBULATORY_CARE_PROVIDER_SITE_OTHER): Payer: BC Managed Care – PPO

## 2021-04-02 DIAGNOSIS — J309 Allergic rhinitis, unspecified: Secondary | ICD-10-CM | POA: Diagnosis not present

## 2021-04-10 DIAGNOSIS — J3089 Other allergic rhinitis: Secondary | ICD-10-CM

## 2021-04-10 NOTE — Progress Notes (Signed)
VIALS MADE. EXP 04-10-22 

## 2021-05-03 ENCOUNTER — Ambulatory Visit (INDEPENDENT_AMBULATORY_CARE_PROVIDER_SITE_OTHER): Payer: BC Managed Care – PPO | Admitting: *Deleted

## 2021-05-03 DIAGNOSIS — J309 Allergic rhinitis, unspecified: Secondary | ICD-10-CM | POA: Diagnosis not present

## 2021-05-16 ENCOUNTER — Ambulatory Visit (INDEPENDENT_AMBULATORY_CARE_PROVIDER_SITE_OTHER): Payer: BC Managed Care – PPO | Admitting: *Deleted

## 2021-05-16 DIAGNOSIS — J309 Allergic rhinitis, unspecified: Secondary | ICD-10-CM

## 2021-05-28 ENCOUNTER — Ambulatory Visit (INDEPENDENT_AMBULATORY_CARE_PROVIDER_SITE_OTHER): Payer: BC Managed Care – PPO

## 2021-05-28 DIAGNOSIS — J309 Allergic rhinitis, unspecified: Secondary | ICD-10-CM | POA: Diagnosis not present

## 2021-06-04 ENCOUNTER — Ambulatory Visit (INDEPENDENT_AMBULATORY_CARE_PROVIDER_SITE_OTHER): Payer: BC Managed Care – PPO | Admitting: *Deleted

## 2021-06-04 DIAGNOSIS — J309 Allergic rhinitis, unspecified: Secondary | ICD-10-CM | POA: Diagnosis not present

## 2021-06-11 ENCOUNTER — Ambulatory Visit (INDEPENDENT_AMBULATORY_CARE_PROVIDER_SITE_OTHER): Payer: BC Managed Care – PPO

## 2021-06-11 DIAGNOSIS — J309 Allergic rhinitis, unspecified: Secondary | ICD-10-CM | POA: Diagnosis not present

## 2021-06-19 ENCOUNTER — Ambulatory Visit (INDEPENDENT_AMBULATORY_CARE_PROVIDER_SITE_OTHER): Payer: BC Managed Care – PPO | Admitting: *Deleted

## 2021-06-19 DIAGNOSIS — J309 Allergic rhinitis, unspecified: Secondary | ICD-10-CM | POA: Diagnosis not present

## 2021-06-28 ENCOUNTER — Ambulatory Visit (INDEPENDENT_AMBULATORY_CARE_PROVIDER_SITE_OTHER): Payer: BC Managed Care – PPO

## 2021-06-28 DIAGNOSIS — J309 Allergic rhinitis, unspecified: Secondary | ICD-10-CM | POA: Diagnosis not present

## 2021-07-09 ENCOUNTER — Ambulatory Visit (INDEPENDENT_AMBULATORY_CARE_PROVIDER_SITE_OTHER): Payer: BC Managed Care – PPO | Admitting: *Deleted

## 2021-07-09 DIAGNOSIS — J309 Allergic rhinitis, unspecified: Secondary | ICD-10-CM

## 2021-07-24 ENCOUNTER — Ambulatory Visit (INDEPENDENT_AMBULATORY_CARE_PROVIDER_SITE_OTHER): Payer: BC Managed Care – PPO

## 2021-07-24 DIAGNOSIS — J309 Allergic rhinitis, unspecified: Secondary | ICD-10-CM | POA: Diagnosis not present

## 2021-08-03 ENCOUNTER — Other Ambulatory Visit: Payer: Self-pay | Admitting: Medical

## 2021-08-08 ENCOUNTER — Ambulatory Visit (INDEPENDENT_AMBULATORY_CARE_PROVIDER_SITE_OTHER): Payer: BC Managed Care – PPO

## 2021-08-08 DIAGNOSIS — J309 Allergic rhinitis, unspecified: Secondary | ICD-10-CM | POA: Diagnosis not present

## 2021-08-14 NOTE — Progress Notes (Signed)
VIALS MADE. EXP 08-14-22 °

## 2021-08-15 DIAGNOSIS — J3081 Allergic rhinitis due to animal (cat) (dog) hair and dander: Secondary | ICD-10-CM

## 2021-08-20 ENCOUNTER — Ambulatory Visit (INDEPENDENT_AMBULATORY_CARE_PROVIDER_SITE_OTHER): Payer: BC Managed Care – PPO

## 2021-08-20 DIAGNOSIS — J309 Allergic rhinitis, unspecified: Secondary | ICD-10-CM

## 2021-09-04 ENCOUNTER — Ambulatory Visit (INDEPENDENT_AMBULATORY_CARE_PROVIDER_SITE_OTHER): Payer: BC Managed Care – PPO

## 2021-09-04 DIAGNOSIS — J309 Allergic rhinitis, unspecified: Secondary | ICD-10-CM | POA: Diagnosis not present

## 2021-09-17 ENCOUNTER — Ambulatory Visit (INDEPENDENT_AMBULATORY_CARE_PROVIDER_SITE_OTHER): Payer: BC Managed Care – PPO

## 2021-09-17 ENCOUNTER — Encounter: Payer: Self-pay | Admitting: Orthopaedic Surgery

## 2021-09-17 DIAGNOSIS — J309 Allergic rhinitis, unspecified: Secondary | ICD-10-CM

## 2021-09-23 ENCOUNTER — Other Ambulatory Visit: Payer: Self-pay | Admitting: Medical

## 2021-10-01 ENCOUNTER — Ambulatory Visit (INDEPENDENT_AMBULATORY_CARE_PROVIDER_SITE_OTHER): Payer: BC Managed Care – PPO

## 2021-10-01 DIAGNOSIS — J309 Allergic rhinitis, unspecified: Secondary | ICD-10-CM

## 2021-10-02 ENCOUNTER — Ambulatory Visit: Payer: Self-pay

## 2021-10-02 ENCOUNTER — Other Ambulatory Visit: Payer: Self-pay

## 2021-10-02 ENCOUNTER — Encounter: Payer: Self-pay | Admitting: Orthopaedic Surgery

## 2021-10-02 ENCOUNTER — Ambulatory Visit: Payer: BC Managed Care – PPO | Admitting: Orthopaedic Surgery

## 2021-10-02 DIAGNOSIS — M25551 Pain in right hip: Secondary | ICD-10-CM

## 2021-10-02 DIAGNOSIS — M84351A Stress fracture, right femur, initial encounter for fracture: Secondary | ICD-10-CM | POA: Diagnosis not present

## 2021-10-02 NOTE — Progress Notes (Signed)
Office Visit Note   Patient: Cameron Joyce           Date of Birth: 12/06/1975           MRN: [id number] Visit Date: 10/02/2021              Requested by: Jac Canavan, PA-C [address] [place],  [place] [address] PCP: Jac Canavan, PA-C   Assessment & Plan: Visit Diagnoses:  1. Stress fracture of neck of right femur   2. Pain in right hip     Plan: Virat returns today for recurrence of right hip and groin pain about 2 months ago.  He returned back to running and has started to experience thigh and groin pain that is worse after prolonged mobilization or twisting and getting out of bed.  The symptoms are reminiscent of his previous symptoms when he had a stress fracture of the femoral neck.  Denies any injuries.  Examination of right hip shows good range of motion with pain with logroll and with FADIR and FABER.  No tenderness palpation.  Based on these findings and previous history of stress fracture within the last couple years we will need another MRI to evaluate for stress fracture.  It is possible that he may have a labral tear.  Follow-up after the MRI.  Follow-Up Instructions: No follow-ups on file.   Orders:  Orders Placed This Encounter  Procedures   XR HIP UNILAT W OR W/O PELVIS 2-3 VIEWS RIGHT   No orders of the defined types were placed in this encounter.     Procedures: No procedures performed   Clinical Data: No additional findings.   Subjective: Chief Complaint  Patient presents with   Right Hip - Pain    HPI  Review of Systems   Objective: Vital Signs: There were no vitals taken for this visit.  Physical Exam  Ortho Exam  Specialty Comments:  No specialty comments available.  Imaging: XR HIP UNILAT W OR W/O PELVIS 2-3 VIEWS RIGHT  Result Date: 10/02/2021 No acute abnormalities.  Potential pincer deformity of the acetabulum.  Sclerotic bone on the right femoral neck.    PMFS History: Patient Active Problem List    Diagnosis Date Noted   Need for Tdap vaccination 02/08/2021   History of hip fracture 02/08/2021   Chronic right hip pain 07/03/2020   Encounter for health maintenance examination in adult 11/05/2019   Gastroesophageal reflux disease 11/05/2019   Hiatal hernia 11/05/2019   Allergic rhinitis due to pollen 11/05/2019   Hyperlipidemia 11/05/2019   Work stress 11/05/2019   Vaccine counseling 11/05/2019   Seasonal and perennial allergic rhinitis 11/01/2019   Allergic conjunctivitis 11/01/2019   Past Medical History:  Diagnosis Date   Allergy    Anxiety    Hyperlipidemia     Family History  Problem Relation Age of Onset   Healthy Mother    Diabetes Father    Healthy Brother    Colon polyps Brother    Healthy Brother    Arthritis Maternal Grandmother    Diabetes Paternal Grandmother    Lung cancer Paternal Grandmother    Diabetes Paternal Grandfather    Heart disease Paternal Grandfather     Past Surgical History:  Procedure Laterality Date   COLONOSCOPY  01/2017   ESOPHAGOGASTRODUODENOSCOPY  01/2017   hiatal hernia   NASAL SEPTUM SURGERY  2018   SEPTOPLASTY     & turbinate reduction   Social History   Occupational History  Not on file  Tobacco Use   Smoking status: Never   Smokeless tobacco: Never  Vaping Use   Vaping Use: Never used  Substance and Sexual Activity   Alcohol use: Yes    Alcohol/week: 3.0 standard drinks    Types: 3 Shots of liquor per week   Drug use: Never   Sexual activity: Not on file

## 2021-10-02 NOTE — Addendum Note (Signed)
Addended by: Lendon Collar on: 10/02/2021 04:27 PM   Modules accepted: Orders

## 2021-10-10 ENCOUNTER — Ambulatory Visit (INDEPENDENT_AMBULATORY_CARE_PROVIDER_SITE_OTHER): Payer: BC Managed Care – PPO

## 2021-10-10 DIAGNOSIS — J309 Allergic rhinitis, unspecified: Secondary | ICD-10-CM | POA: Diagnosis not present

## 2021-10-22 ENCOUNTER — Ambulatory Visit (INDEPENDENT_AMBULATORY_CARE_PROVIDER_SITE_OTHER): Payer: BC Managed Care – PPO | Admitting: *Deleted

## 2021-10-22 DIAGNOSIS — J309 Allergic rhinitis, unspecified: Secondary | ICD-10-CM | POA: Diagnosis not present

## 2021-10-25 ENCOUNTER — Ambulatory Visit
Admission: RE | Admit: 2021-10-25 | Discharge: 2021-10-25 | Disposition: A | Discharge status: Home / Self Care | Payer: BC Managed Care – PPO | Source: Ambulatory Visit | Attending: Orthopaedic Surgery | Admitting: Orthopaedic Surgery

## 2021-10-25 ENCOUNTER — Other Ambulatory Visit: Payer: Self-pay

## 2021-10-25 DIAGNOSIS — M84351A Stress fracture, right femur, initial encounter for fracture: Secondary | ICD-10-CM

## 2021-10-25 IMAGING — XA DG FLUORO GUIDE NDL PLC/BX
1 series · 1 of 1 positions shown · non-contrast
Comparison: none

CLINICAL DATA: Right hip pain. Stress fracture of the neck of right
femur. Injection for MR arthrography.

[Series 1: ortho standard · 1 of 1 slices shown]
[im 1/1]
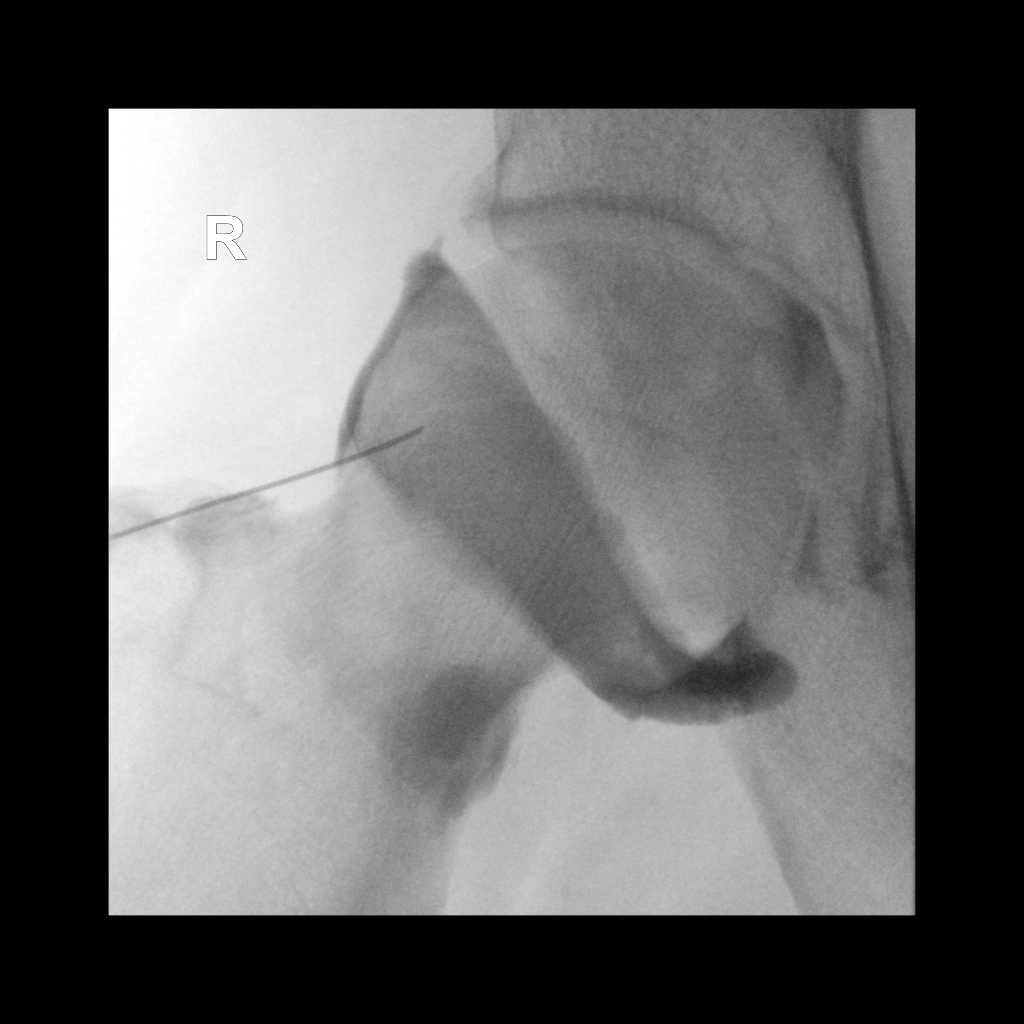

[1 of 1 positions shown; findings below may reference images not displayed]

FLUOROSCOPY:
0 minutes 22 seconds.  33.95 micro gray meter squared

PROCEDURE:
Right hip INJECTION UNDER FLUOROSCOPY

The skin overlying the hip was scrubbed with Betadine and draped in
sterile fashion. Skin and subcutaneous anesthesia was carried out
using a 25 gauge needle and 1% lidocaine. A 22 gauge spinal needle
was directed under fluoroscopic guidance on one pass into the hip
joint. 20 cc of a mixture of 0.1 cc MultiHance and dilute Isovue 200
was then used to fill the hip joint.
IMPRESSION: Technically successful right hip injection for MRI.

## 2021-10-25 IMAGING — MR MR HIP*R* W/CM
4 of 6 series · 16 of 40 positions shown · IV contrast (multihance)
Comparison: None.

CLINICAL DATA: right hip MRI arthrogram

EXAM:
MRI OF THE RIGHT HIP WITH CONTRAST
TECHNIQUE: Multiplanar, multisequence MR imaging was performed following the
administration of intravenous contrast.
CONTRAST:  5mL MULTIHANCE GADOBENATE DIMEGLUMINE 529 MG/ML IV SOLN

[Series 3: T1 · coronal · 4.0mm · 0.53mm/px · 6 of 24 slices shown]
[im 1/24]
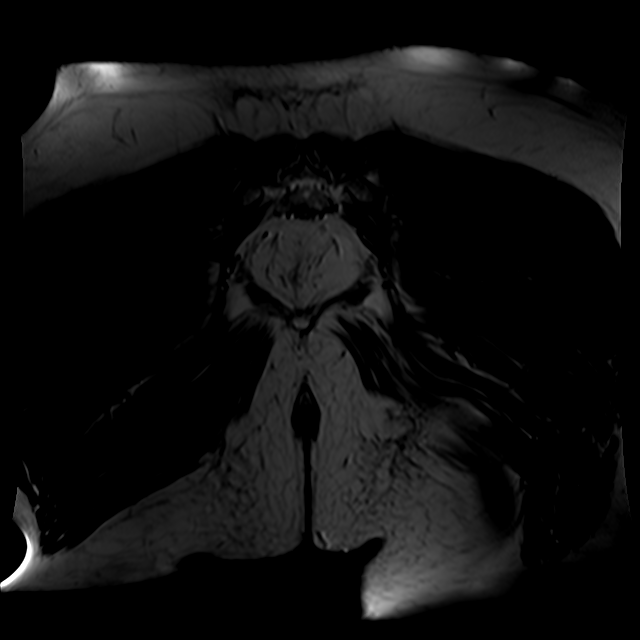
[im 5/24]
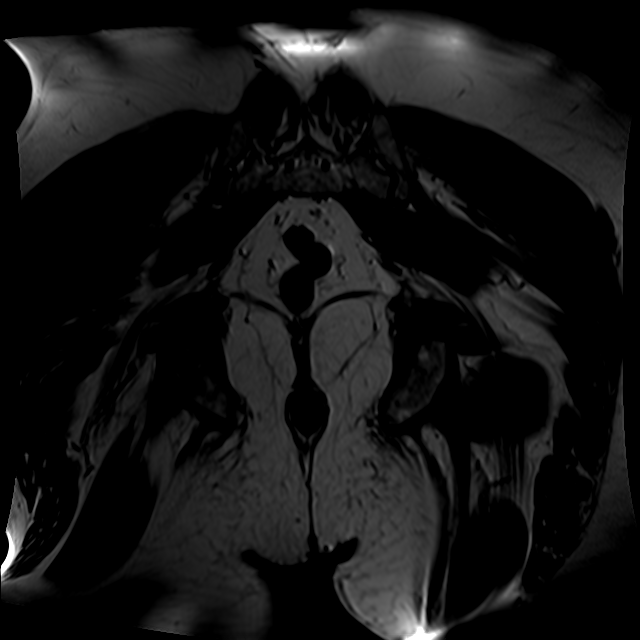
[im 10/24]
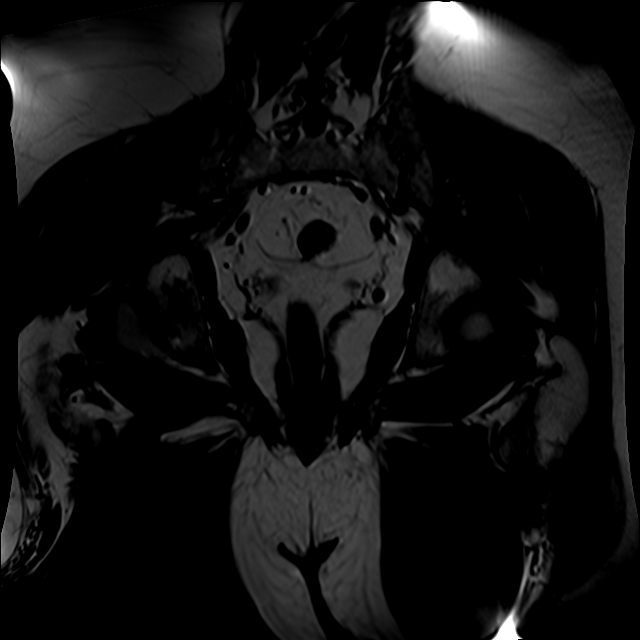
[im 14/24]
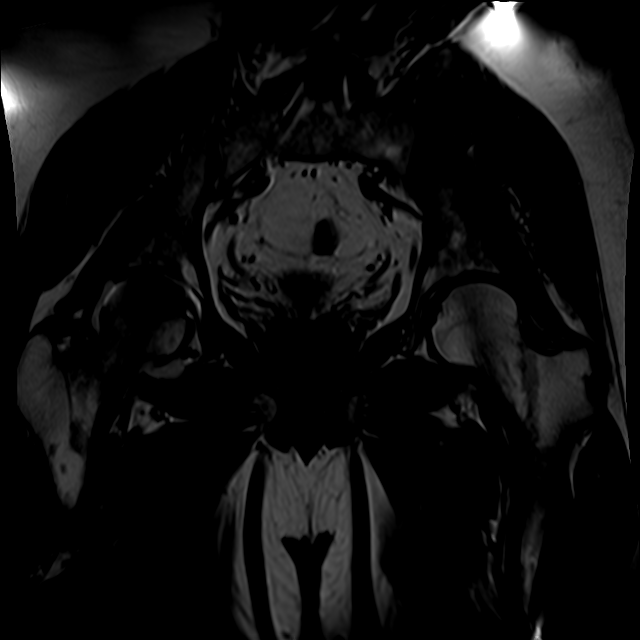
[im 19/24]
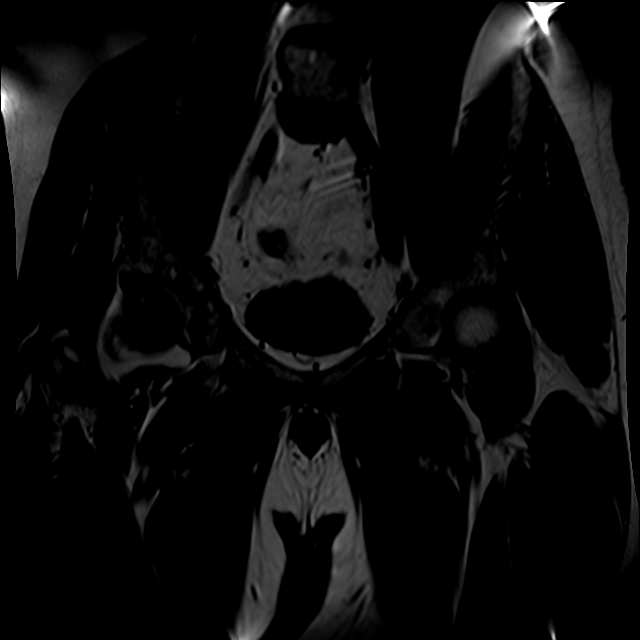
[im 24/24]
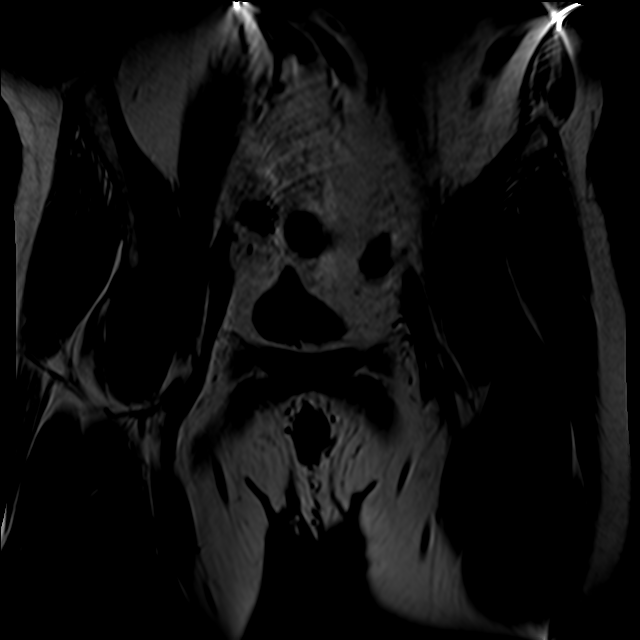

[Series 4: T2 fat-sat · coronal · 4.0mm · 0.53mm/px · 4 of 24 slices shown (1 of 2)]
[im 1/24]
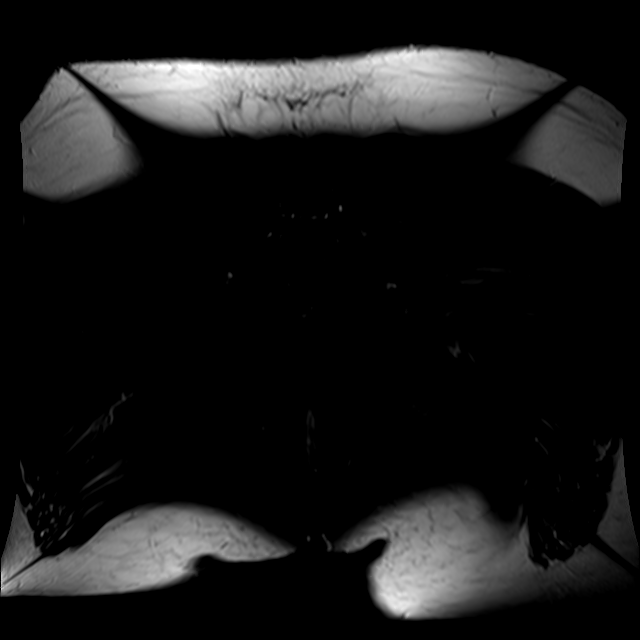
[im 4/24]
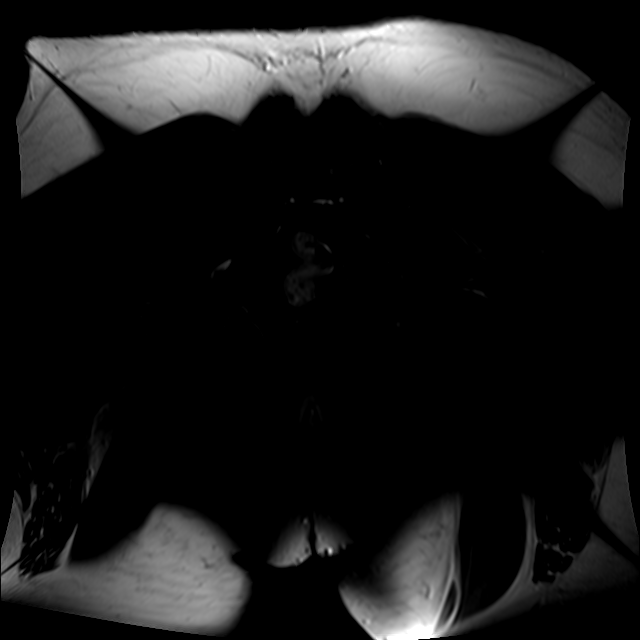
[im 12/24]
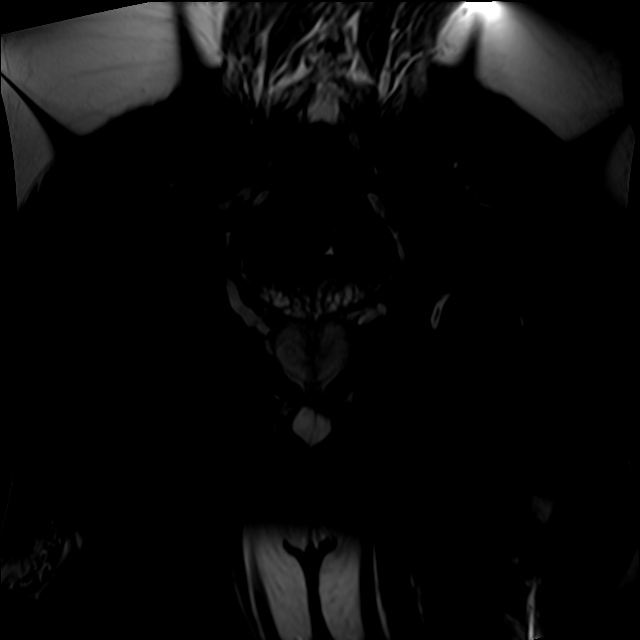
[im 20/24]
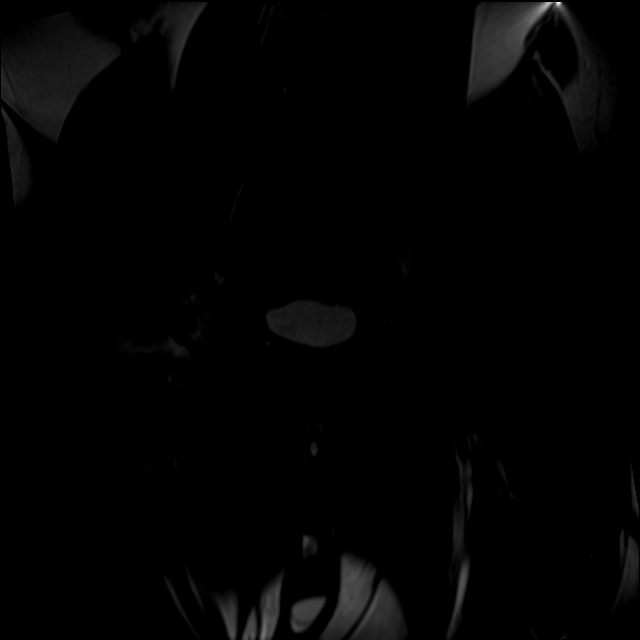

[Series 5: T2 fat-sat · axial · 4.0mm · 0.70mm/px · z∈[-62,+38]mm · 3 of 29 slices shown (2 of 2)]
[im 5/29]
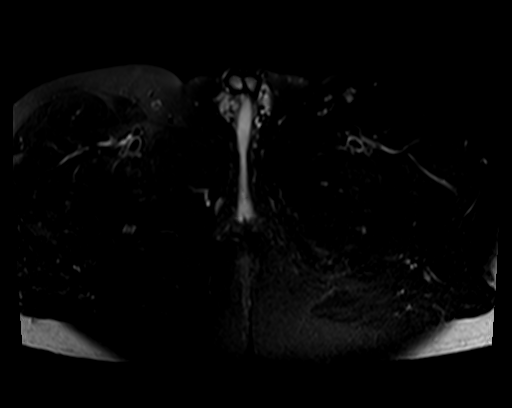
[im 17/29]
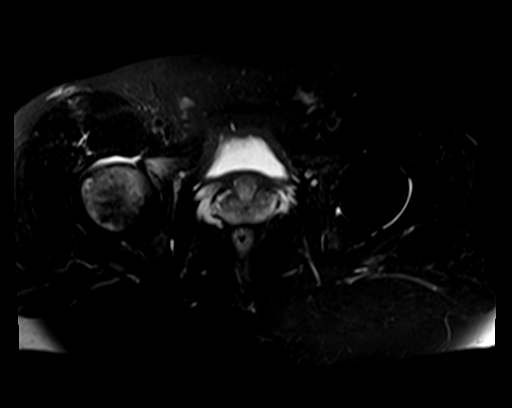
[im 25/29]
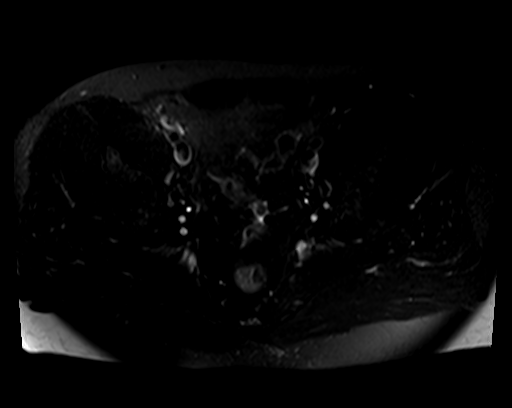

[Series 6: T1 fat-sat · axial · 4.0mm · 0.70mm/px · z∈[-59,+9]mm · 3 of 22 slices shown]
[im 5/22]
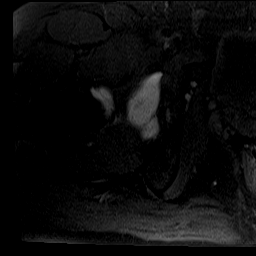
[im 13/22]
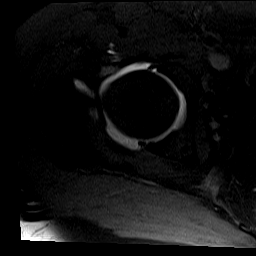
[im 22/22]
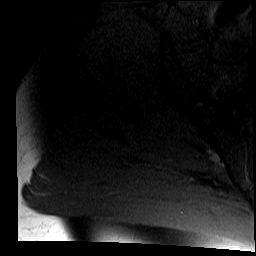

[16 of 40 positions shown; findings below may reference images not displayed]

FINDINGS: Bones: There is avascular necrosis of the right femoral head with
greater than 50% involvement. There is extensive associated marrow
edema in the femoral head/neck. There is persistent mild edema along
the chronic stress fracture at the medial base of the femoral neck.
There is no evidence of articular surface collapse. There is an
apparent nondisplaced fracture of the anterior acetabulum (coronal
T2 fat-sat images 18-20), with adjacent marrow edema. The visualized
sacroiliac joints and symphysis pubis appear normal.

Articular cartilage and labrum

Articular cartilage:  No focal chondral defect.

Labrum: There is a probable nondisplaced tear at the chondrolabral
junction of the anterior superior labrum (series 7, image 11).

Joint or bursal effusion

Joint effusion: Adequate joint distension by arthrogram technique.

Bursae: No evidence of trochanteric bursitis.

Muscles and tendons

Muscles and tendons: The gluteal tendons are intact. The proximal
hamstrings are intact.The adductors are intact. No muscle atrophy of
edema.

Other findings

Miscellaneous: The visualized internal pelvic contents appear
unremarkable.
IMPRESSION: Avascular necrosis of the right femoral head with greater than 50%
involvement. No articular surface collapse or significant arthritis.
Extensive associated marrow edema throughout the femoral head/neck
including residual edema at the site of prior femoral neck stress
fracture. No evidence of stress fracture progression at the base of
the femoral neck.

New nondisplaced fracture of the anterior acetabulum with intense
associated marrow edema.

Probable nondisplaced anterior superior labral tear at the
chondrolabral junction.

## 2021-10-25 MED ORDER — IOPAMIDOL (ISOVUE-M 200) INJECTION 41%
15.0000 mL | Freq: Once | INTRAMUSCULAR | Status: AC
  Administered 2021-10-25: 15 mL via INTRA_ARTICULAR

## 2021-10-25 MED ORDER — GADOBENATE DIMEGLUMINE 529 MG/ML IV SOLN
5.0000 mL | Freq: Once | INTRAVENOUS | Status: AC | PRN
  Administered 2021-10-25: 5 mL via INTRAVENOUS

## 2021-10-25 NOTE — Progress Notes (Signed)
Needs appt

## 2021-10-29 ENCOUNTER — Telehealth: Payer: Self-pay

## 2021-10-29 ENCOUNTER — Telehealth: Payer: Self-pay | Admitting: Orthopaedic Surgery

## 2021-10-29 NOTE — Telephone Encounter (Signed)
Called patient no answer. LMOM. Needs appt to review MRI. 

## 2021-10-29 NOTE — Progress Notes (Signed)
LMOM he needs appt to review MRI ?

## 2021-10-29 NOTE — Telephone Encounter (Signed)
Called pt left 1X vm to call and set MRI review appt with Dr. Erlinda Hong. Pt asked for appt to be after 2pm on mychart message ?

## 2021-10-30 ENCOUNTER — Ambulatory Visit (INDEPENDENT_AMBULATORY_CARE_PROVIDER_SITE_OTHER): Payer: BC Managed Care – PPO

## 2021-10-30 DIAGNOSIS — J309 Allergic rhinitis, unspecified: Secondary | ICD-10-CM

## 2021-11-06 ENCOUNTER — Other Ambulatory Visit: Payer: Self-pay

## 2021-11-06 ENCOUNTER — Ambulatory Visit (INDEPENDENT_AMBULATORY_CARE_PROVIDER_SITE_OTHER): Payer: BC Managed Care – PPO

## 2021-11-06 ENCOUNTER — Ambulatory Visit (INDEPENDENT_AMBULATORY_CARE_PROVIDER_SITE_OTHER): Payer: BC Managed Care – PPO | Admitting: Orthopaedic Surgery

## 2021-11-06 ENCOUNTER — Other Ambulatory Visit: Payer: Self-pay | Admitting: Medical

## 2021-11-06 DIAGNOSIS — M25551 Pain in right hip: Secondary | ICD-10-CM | POA: Diagnosis not present

## 2021-11-06 DIAGNOSIS — J309 Allergic rhinitis, unspecified: Secondary | ICD-10-CM

## 2021-11-06 NOTE — Progress Notes (Signed)
? ?Office Visit Note ?  ?Patient: Cameron Joyce           ?Date of Birth: 31-Jan-1976           ?MRN: [id number] ?Visit Date: 11/06/2021 ?             ?Requested by: Cameron Canavan, PA-C ?[address] ?[place],  [place] [address] ?PCP: Cameron Canavan, PA-C ? ? ?Assessment & Plan: ?Visit Diagnoses:  ?1. Pain in right hip   ? ? ?Plan: Cameron Joyce returns today to discuss right hip MRI.  Cameron Joyce continues to have no pain with weightbearing or normal walking.  Cameron Joyce only has pain when Cameron Joyce turns in bed. ? ?Examination of the right hip is unchanged. ? ?MRI of the right hip shows a nondisplaced stress fracture with surrounding edema of the acetabulum.  There is no collapse of the acetabulum.  There is extensive bony marrow edema of the femoral head and neck with serpiginous changes consistent with avascular necrosis.  The prior stress fracture has healed.  These findings were reviewed with Cameron Joyce today and honestly I am very surprised by the findings.  Cameron Joyce denies any risk factors for avascular necrosis.  At this time there is not any collapse of the femoral head.  His symptoms are minimal.  I think the best thing to do is to make a referral to Dr. Denyse Joyce or Cameron Joyce to talk about medical management options such as bisphosphonates. ? ?Follow-Up Instructions: No follow-ups on file.  ? ?Orders:  ?No orders of the defined types were placed in this encounter. ? ?No orders of the defined types were placed in this encounter. ? ? ? ? Procedures: ?No procedures performed ? ? ?Clinical Data: ?No additional findings. ? ? ?Subjective: ?Chief Complaint  ?Patient presents with  ? Right Hip - Follow-up  ? ? ?HPI ? ?Review of Systems ? ? ?Objective: ?Vital Signs: There were no vitals taken for this visit. ? ?Physical Exam ? ?Ortho Exam ? ?Specialty Comments:  ?No specialty comments available. ? ?Imaging: ?No results found. ? ? ?PMFS History: ?Patient Active Problem List  ? Diagnosis Date Noted  ? Pain in right hip 11/06/2021  ? Need for Tdap vaccination  02/08/2021  ? History of hip fracture 02/08/2021  ? Chronic right hip pain 07/03/2020  ? Encounter for health maintenance examination in adult 11/05/2019  ? Gastroesophageal reflux disease 11/05/2019  ? Hiatal hernia 11/05/2019  ? Allergic rhinitis due to pollen 11/05/2019  ? Hyperlipidemia 11/05/2019  ? Work stress 11/05/2019  ? Vaccine counseling 11/05/2019  ? Seasonal and perennial allergic rhinitis 11/01/2019  ? Allergic conjunctivitis 11/01/2019  ? ?Past Medical History:  ?Diagnosis Date  ? Allergy   ? Anxiety   ? Hyperlipidemia   ?  ?Family History  ?Problem Relation Age of Onset  ? Healthy Mother   ? Diabetes Father   ? Healthy Brother   ? Colon polyps Brother   ? Healthy Brother   ? Arthritis Maternal Grandmother   ? Diabetes Paternal Grandmother   ? Lung cancer Paternal Grandmother   ? Diabetes Paternal Grandfather   ? Heart disease Paternal Grandfather   ?  ?Past Surgical History:  ?Procedure Laterality Date  ? COLONOSCOPY  01/2017  ? ESOPHAGOGASTRODUODENOSCOPY  01/2017  ? hiatal hernia  ? NASAL SEPTUM SURGERY  2018  ? SEPTOPLASTY    ? & turbinate reduction  ? ?Social History  ? ?Occupational History  ? Not on file  ?Tobacco Use  ? Smoking status:  Never  ? Smokeless tobacco: Never  ?Vaping Use  ? Vaping Use: Never used  ?Substance and Sexual Activity  ? Alcohol use: Yes  ?  Alcohol/week: 3.0 standard drinks  ?  Types: 3 Shots of liquor per week  ? Drug use: Never  ? Sexual activity: Not on file  ? ? ? ? ? ? ?

## 2021-11-13 ENCOUNTER — Other Ambulatory Visit: Payer: Self-pay

## 2021-11-13 DIAGNOSIS — M25551 Pain in right hip: Secondary | ICD-10-CM

## 2021-11-19 ENCOUNTER — Telehealth: Payer: Self-pay

## 2021-11-19 ENCOUNTER — Ambulatory Visit (INDEPENDENT_AMBULATORY_CARE_PROVIDER_SITE_OTHER): Payer: BC Managed Care – PPO

## 2021-11-19 DIAGNOSIS — J309 Allergic rhinitis, unspecified: Secondary | ICD-10-CM | POA: Diagnosis not present

## 2021-11-19 NOTE — Telephone Encounter (Signed)
Ok to change his allergy injections to every 2-3 weeks.

## 2021-11-19 NOTE — Telephone Encounter (Signed)
Spoke with patient, informed him that he could go every 3 weeks. Patient verbalized understanding. Patient stated that he will go every 3 weeks however depending how he does will determine if he continues the every 3 weeks or stay at the every 2 weeks. ?

## 2021-11-19 NOTE — Telephone Encounter (Signed)
Patient came in to get his allergy injections and when I asked how his son was doing with his injections, he informed me that he is moving to [place] end of June/ beginning of July. Patient asked about his allergy injections. I informed patient that we would not order anymore and a DNO was placed on his vials. I informed patient that he may not have the same issues in [place] as he does here. He is going to find another allergist there however in the mean time patient is wondering since he would be going to every 3 weeks in June if he can go ahead an go to every 3 weeks to space his injections out to last until he moves. Please advise ?

## 2021-11-21 ENCOUNTER — Ambulatory Visit (HOSPITAL_BASED_OUTPATIENT_CLINIC_OR_DEPARTMENT_OTHER): Payer: Self-pay | Admitting: Orthopaedic Surgery

## 2021-11-21 ENCOUNTER — Ambulatory Visit (HOSPITAL_BASED_OUTPATIENT_CLINIC_OR_DEPARTMENT_OTHER): Payer: BC Managed Care – PPO | Admitting: Orthopaedic Surgery

## 2021-11-21 ENCOUNTER — Other Ambulatory Visit (HOSPITAL_BASED_OUTPATIENT_CLINIC_OR_DEPARTMENT_OTHER): Payer: Self-pay

## 2021-11-21 ENCOUNTER — Encounter (HOSPITAL_BASED_OUTPATIENT_CLINIC_OR_DEPARTMENT_OTHER): Payer: Self-pay | Admitting: Orthopaedic Surgery

## 2021-11-21 DIAGNOSIS — M87051 Idiopathic aseptic necrosis of right femur: Secondary | ICD-10-CM

## 2021-11-21 MED ORDER — IBUPROFEN 800 MG PO TABS
800.0000 mg | ORAL_TABLET | Freq: Three times a day (TID) | ORAL | 0 refills | Status: AC
  Filled 2021-11-21: qty 30, 10d supply, fill #0

## 2021-11-21 MED ORDER — ASPIRIN EC 325 MG PO TBEC
325.0000 mg | DELAYED_RELEASE_TABLET | Freq: Every day | ORAL | 0 refills | Status: AC
  Filled 2021-11-21: qty 30, 30d supply, fill #0

## 2021-11-21 MED ORDER — OXYCODONE HCL 5 MG PO TABS
5.0000 mg | ORAL_TABLET | ORAL | 0 refills | Status: AC | PRN
Start: 2021-11-21 — End: ?
  Filled 2021-11-21: qty 20, 4d supply, fill #0

## 2021-11-21 MED ORDER — ACETAMINOPHEN 500 MG PO TABS
500.0000 mg | ORAL_TABLET | Freq: Three times a day (TID) | ORAL | 0 refills | Status: AC
  Filled 2021-11-21: qty 30, 10d supply, fill #0

## 2021-11-21 NOTE — H&P (View-Only) (Signed)
? ?                            ? ? ?Chief Complaint: Right hip pain ?  ? ? ?History of Present Illness:  ? ? ?Cameron Joyce is a 46 y.o. male presents with right hip pain which has been ongoing since 2021.  Of note he was previously fairly avid runner running multiple miles.  He was diagnosed with a stress fracture involving the femoral neck which was treated with 6 weeks of nonweightbearing in 2021.  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  He has been seeing Dr. Erlinda Hong for this.  He presents today as he does have persistent ongoing pain in the setting of avascular necrosis of the hip.  He has pain with really any rotation of the hip.  He has been using a cane in his right hand to avoid placing weight on this.  He has undergone several months of physical therapy at this point with very limited relief and pain really throughout the majority of most sessions.  He is taking ibuprofen as needed for pain although this helps only somewhat.  He was told that he may eventually need a hip replacement the right hip.  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 ? ? ? ?Surgical History:   ?None ? ?PMH/PSH/Family History/Social History/Meds/Allergies:   ? ?Past Medical History:  ?Diagnosis Date  ? Allergy   ? Anxiety   ? Hyperlipidemia   ? ?Past Surgical History:  ?Procedure Laterality Date  ? COLONOSCOPY  01/2017  ? ESOPHAGOGASTRODUODENOSCOPY  01/2017  ? hiatal hernia  ? NASAL SEPTUM SURGERY  2018  ? SEPTOPLASTY    ? & turbinate reduction  ? ?Social History  ? ?Socioeconomic History  ? Marital status: Married  ?  Spouse name: Not on file  ? Number of children: Not on file  ? Years of education: Not on file  ? Highest education level: Not on file  ?Occupational History  ? Not on file  ?Tobacco Use  ? Smoking status: Never  ? Smokeless tobacco: Never  ?Vaping Use  ? Vaping Use: Never used  ?Substance and Sexual Activity  ? Alcohol use: Yes  ?   Alcohol/week: 3.0 standard drinks  ?  Types: 3 Shots of liquor per week  ? Drug use: Never  ? Sexual activity: Not on file  ?Other Topics Concern  ? Not on file  ?Social History Narrative  ? Married, wife in PhD music program [employer], he teaches music and band at [employer], exercise most days per week, Darrick Meigs, has son in school of arts.   01/2021  ? ?Social Determinants of Health  ? ?Financial Resource Strain: Not on file  ?Food Insecurity: Not on file  ?Transportation Needs: Not on file  ?Physical Activity: Not on file  ?Stress: Not on file  ?Social Connections: Not on file  ? ?Family History  ?Problem Relation Age of Onset  ? Healthy Mother   ? Diabetes Father   ? Healthy Brother   ? Colon polyps Brother   ? Healthy Brother   ? Arthritis Maternal Grandmother   ? Diabetes Paternal Grandmother   ? Lung cancer Paternal Grandmother   ? Diabetes Paternal Grandfather   ? Heart disease Paternal Grandfather   ? ?Allergies  ?Allergen Reactions  ? Grass Pollen([id number]  Vern)   ? Pollen Extract   ? ?Current Outpatient Medications  ?Medication Sig Dispense Refill  ? acetaminophen (TYLENOL) 500 MG tablet Take 1 tablet (500 mg total) by mouth every 8 (eight) hours for 10 days. 30 tablet 0  ? aspirin EC 325 MG tablet Take 1 tablet (325 mg total) by mouth daily. 30 tablet 0  ? ibuprofen (ADVIL) 800 MG tablet Take 1 tablet (800 mg total) by mouth every 8 (eight) hours for 10 days. Please take with food, please alternate with acetaminophen 30 tablet 0  ? oxyCODONE (OXY IR/ROXICODONE) 5 MG immediate release tablet Take 1 tablet (5 mg total) by mouth every 4 (four) hours as needed (severe pain). 20 tablet 0  ? ALPRAZolam (XANAX) 0.5 MG tablet Take 1 tablet (0.5 mg total) by mouth at bedtime as needed for anxiety. 30 tablet 0  ? atorvastatin (LIPITOR) 10 MG tablet Take 1 tablet (10 mg total) by mouth daily. 90 tablet 3  ? escitalopram (LEXAPRO) 10 MG tablet TAKE 1 TABLET BY MOUTH EVERY DAY 90 tablet 0  ? Fluticasone  Propionate (XHANCE) 93 MCG/ACT EXHU Place 2 sprays into the nose 2 (two) times daily as needed. (Patient not taking: No sig reported) 32 mL 5  ? Olopatadine HCl (PATADAY) 0.2 % SOLN Place 1 drop in each eye once a day as needed for itchy watery eyes 2.5 mL 5  ? omeprazole (PRILOSEC) 20 MG capsule TAKE 1 CAPSULE BY MOUTH EVERY DAY IN THE MORNING 90 capsule 1  ? Vitamin D, Ergocalciferol, (DRISDOL) 1.25 MG (50000 UNIT) CAPS capsule Take 1 capsule (50,000 Units total) by mouth every 7 (seven) days. 12 capsule 3  ? ?No current facility-administered medications for this visit.  ? ?No results found. ? ?Review of Systems:   ?A ROS was performed including pertinent positives and negatives as documented in the HPI. ? ?Physical Exam :   ?Constitutional: NAD and appears stated age ?Neurological: Alert and oriented ?Psych: Appropriate affect and cooperative ?There were no vitals taken for this visit.  ? ?Comprehensive Musculoskeletal Exam:   ? ?Inspection Right Left  ?Skin No atrophy or gross abnormalities appreciated No atrophy or gross abnormalities appreciated  ?Palpation    ?Tenderness Femoral acetabular None  ?Crepitus None None  ?Range of Motion    ?Flexion (passive) 120 120  ?Extension 30 30  ?IR 30 with pain 30  ?ER 45 with pain 45  ?Strength    ?Flexion  5/5 with weakness and pain 5/5  ?Extension 5/5 with weakness and pain 5/5  ?Special Tests    ?FABER Positive Negative  ?FADIR Positive Negative  ?ER Lag/Capsular Insufficiency Negative Negative  ?Instability Negative Negative  ?Sacroiliac pain Negative  Negative   ?Instability    ?Generalized Laxity No No  ?Neurologic    ?sciatic, femoral, obturator nerves intact to light sensation  ?Vascular/Lymphatic    ?DP pulse 2+ 2+  ?Lumbar Exam    ?Patient has symmetric lumbar range of motion with negative pain referral to hip  ? ? ? ?Imaging:   ?Xray (2 views right hip): ?Avascular necrosis involving the right femoral head without evidence of femoral head collapse ? ?MRI right  hip: ?There is avascular necrosis involving nearly the entirety of his weight bearing portion of the femoral head.  There is no evidence of collapse of the femoral head. ? ?I personally reviewed and interpreted the radiographs. ? ? ?Assessment:   ?46 y.o. male with pain in the setting of ongoing known avascular necrosis of  the right hip following a femoral stress fracture which was treated with closed management.  We did discuss that this is the likely etiology of his pain.  He is very young and hopes to remain very active.  He has not been able to run as result of his hip pain.  He has undergone extensive physical therapy which only seems to aggravate his hip pain.  At this point he is somewhat frustrated as he does not ultimately wish to undergo hip arthroplasty.  He has been eating a cane in the right hand.  I spoke with him specifically about treatment options.  We did discuss the possibility of a core decompression with injection of bone marrow aspirate concentrate in order to promote healing environment for his AVN to heal and ultimately not collapse.  We did discuss that given his large portion of the involvement of the femoral head that left untreated, collapse of the femoral head would be a distinct possibility.  He would like to defer a hip arthroplasty at this young age as much as possible.  After long discussion, he is elected for right hip core decompression with bone marrow aspirate injection. ? ?Plan :   ? ?-Plan for right hip core decompression with bone marrow aspiration injection ? ? ?After a lengthy discussion of treatment options, including risks, benefits, alternatives, complications of surgical and nonsurgical conservative options, the patient elected surgical repair.  ? ?The patient  is aware of the material risks  and complications including, but not limited to injury to adjacent structures, neurovascular injury, infection, numbness, bleeding, implant failure, thermal burns, stiffness,  persistent pain, failure to heal, disease transmission from allograft, need for further surgery, dislocation, anesthetic risks, blood clots, risks of death,and others. The probabilities of surgical succes

## 2021-11-21 NOTE — Progress Notes (Addendum)
? ?                            ? ? ?Chief Complaint: Right hip pain ?  ? ? ?History of Present Illness:  ? ? ?Cameron Joyce is a 46 y.o. male presents with right hip pain which has been ongoing since 2021.  Of note he was previously fairly avid runner running multiple miles.  He was diagnosed with a stress fracture involving the femoral neck which was treated with 6 weeks of nonweightbearing in 2021.  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  He has been seeing Dr. Erlinda Hong for this.  He presents today as he does have persistent ongoing pain in the setting of avascular necrosis of the hip.  He has pain with really any rotation of the hip.  He has been using a cane in his right hand to avoid placing weight on this.  He has undergone several months of physical therapy at this point with very limited relief and pain really throughout the majority of most sessions.  He is taking ibuprofen as needed for pain although this helps only somewhat.  He was told that he may eventually need a hip replacement the right hip.  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 ? ? ? ?Surgical History:   ?None ? ?PMH/PSH/Family History/Social History/Meds/Allergies:   ? ?Past Medical History:  ?Diagnosis Date  ? Allergy   ? Anxiety   ? Hyperlipidemia   ? ?Past Surgical History:  ?Procedure Laterality Date  ? COLONOSCOPY  01/2017  ? ESOPHAGOGASTRODUODENOSCOPY  01/2017  ? hiatal hernia  ? NASAL SEPTUM SURGERY  2018  ? SEPTOPLASTY    ? & turbinate reduction  ? ?Social History  ? ?Socioeconomic History  ? Marital status: Married  ?  Spouse name: Not on file  ? Number of children: Not on file  ? Years of education: Not on file  ? Highest education level: Not on file  ?Occupational History  ? Not on file  ?Tobacco Use  ? Smoking status: Never  ? Smokeless tobacco: Never  ?Vaping Use  ? Vaping Use: Never used  ?Substance and Sexual Activity  ? Alcohol use: Yes  ?   Alcohol/week: 3.0 standard drinks  ?  Types: 3 Shots of liquor per week  ? Drug use: Never  ? Sexual activity: Not on file  ?Other Topics Concern  ? Not on file  ?Social History Narrative  ? Married, wife in PhD music program [employer]ling [employer], he teaches music and band at [employer], exercise most days per week, Darrick Meigs, has son in school of arts.   01/2021  ? ?Social Determinants of Health  ? ?Financial Resource Strain: Not on file  ?Food Insecurity: Not on file  ?Transportation Needs: Not on file  ?Physical Activity: Not on file  ?Stress: Not on file  ?Social Connections: Not on file  ? ?Family History  ?Problem Relation Age of Onset  ? Healthy Mother   ? Diabetes Father   ? Healthy Brother   ? Colon polyps Brother   ? Healthy Brother   ? Arthritis Maternal Grandmother   ? Diabetes Paternal Grandmother   ? Lung cancer Paternal Grandmother   ? Diabetes Paternal Grandfather   ? Heart disease Paternal Grandfather   ? ?Allergies  ?Allergen Reactions  ? Grass Pollen([id number]  Vern)   ? Pollen Extract   ? ?Current Outpatient Medications  ?Medication Sig Dispense Refill  ? acetaminophen (TYLENOL) 500 MG tablet Take 1 tablet (500 mg total) by mouth every 8 (eight) hours for 10 days. 30 tablet 0  ? aspirin EC 325 MG tablet Take 1 tablet (325 mg total) by mouth daily. 30 tablet 0  ? ibuprofen (ADVIL) 800 MG tablet Take 1 tablet (800 mg total) by mouth every 8 (eight) hours for 10 days. Please take with food, please alternate with acetaminophen 30 tablet 0  ? oxyCODONE (OXY IR/ROXICODONE) 5 MG immediate release tablet Take 1 tablet (5 mg total) by mouth every 4 (four) hours as needed (severe pain). 20 tablet 0  ? ALPRAZolam (XANAX) 0.5 MG tablet Take 1 tablet (0.5 mg total) by mouth at bedtime as needed for anxiety. 30 tablet 0  ? atorvastatin (LIPITOR) 10 MG tablet Take 1 tablet (10 mg total) by mouth daily. 90 tablet 3  ? escitalopram (LEXAPRO) 10 MG tablet TAKE 1 TABLET BY MOUTH EVERY DAY 90 tablet 0  ? Fluticasone  Propionate (XHANCE) 93 MCG/ACT EXHU Place 2 sprays into the nose 2 (two) times daily as needed. (Patient not taking: No sig reported) 32 mL 5  ? Olopatadine HCl (PATADAY) 0.2 % SOLN Place 1 drop in each eye once a day as needed for itchy watery eyes 2.5 mL 5  ? omeprazole (PRILOSEC) 20 MG capsule TAKE 1 CAPSULE BY MOUTH EVERY DAY IN THE MORNING 90 capsule 1  ? Vitamin D, Ergocalciferol, (DRISDOL) 1.25 MG (50000 UNIT) CAPS capsule Take 1 capsule (50,000 Units total) by mouth every 7 (seven) days. 12 capsule 3  ? ?No current facility-administered medications for this visit.  ? ?No results found. ? ?Review of Systems:   ?A ROS was performed including pertinent positives and negatives as documented in the HPI. ? ?Physical Exam :   ?Constitutional: NAD and appears stated age ?Neurological: Alert and oriented ?Psych: Appropriate affect and cooperative ?There were no vitals taken for this visit.  ? ?Comprehensive Musculoskeletal Exam:   ? ?Inspection Right Left  ?Skin No atrophy or gross abnormalities appreciated No atrophy or gross abnormalities appreciated  ?Palpation    ?Tenderness Femoral acetabular None  ?Crepitus None None  ?Range of Motion    ?Flexion (passive) 120 120  ?Extension 30 30  ?IR 30 with pain 30  ?ER 45 with pain 45  ?Strength    ?Flexion  5/5 with weakness and pain 5/5  ?Extension 5/5 with weakness and pain 5/5  ?Special Tests    ?FABER Positive Negative  ?FADIR Positive Negative  ?ER Lag/Capsular Insufficiency Negative Negative  ?Instability Negative Negative  ?Sacroiliac pain Negative  Negative   ?Instability    ?Generalized Laxity No No  ?Neurologic    ?sciatic, femoral, obturator nerves intact to light sensation  ?Vascular/Lymphatic    ?DP pulse 2+ 2+  ?Lumbar Exam    ?Patient has symmetric lumbar range of motion with negative pain referral to hip  ? ? ? ?Imaging:   ?Xray (2 views right hip): ?Avascular necrosis involving the right femoral head without evidence of femoral head collapse ? ?MRI right  hip: ?There is avascular necrosis involving nearly the entirety of his weight bearing portion of the femoral head.  There is no evidence of collapse of the femoral head. ? ?I personally reviewed and interpreted the radiographs. ? ? ?Assessment:   ?46 y.o. male with pain in the setting of ongoing known avascular necrosis of  the right hip following a femoral stress fracture which was treated with closed management.  We did discuss that this is the likely etiology of his pain.  He is very young and hopes to remain very active.  He has not been able to run as result of his hip pain.  He has undergone extensive physical therapy which only seems to aggravate his hip pain.  At this point he is somewhat frustrated as he does not ultimately wish to undergo hip arthroplasty.  He has been using a cane in the right hand.  I spoke with him specifically about treatment options.  We did discuss the possibility of a core decompression with injection of bone marrow aspirate concentrate in order to promote healing environment for his AVN to heal and ultimately not collapse.  We did discuss that given his large portion of the involvement of the femoral head that, left untreated, collapse of the femoral head would be a distinct possibility.  He would like to defer a hip arthroplasty at this young age as much as possible.  After long discussion, he is elected for right hip core decompression with bone marrow aspirate injection from right iliac crest. ? ?Plan :   ? ?-Plan for right hip core decompression with bone marrow aspiration injection ? ? ?After a lengthy discussion of treatment options, including risks, benefits, alternatives, complications of surgical and nonsurgical conservative options, the patient elected surgical repair.  ? ?The patient  is aware of the material risks  and complications including, but not limited to injury to adjacent structures, neurovascular injury, infection, numbness, bleeding, implant failure, thermal  burns, stiffness, persistent pain, failure to heal, disease transmission from allograft, need for further surgery, dislocation, anesthetic risks, blood clots, risks of death,and others. The probabili

## 2021-11-23 ENCOUNTER — Encounter (HOSPITAL_COMMUNITY): Payer: Self-pay | Admitting: Orthopaedic Surgery

## 2021-11-23 ENCOUNTER — Other Ambulatory Visit: Payer: Self-pay

## 2021-11-23 NOTE — Progress Notes (Signed)
[name] denies chest pain or shortness of breath. Patient denies having any s/s of Covid in his household.  Patient denies any known exposure to Covid.  ? ?[name] MD's: ?PCP is Crosby Oyster, PA-C ?Allergist is Dr. Dr. Idelle Leech. ? ?Cameron Joyce has a Coronary calcium study done 03/26/21, patient's score was 136,  MESA database percentile 96. ? ?I instructed Cameron Joyce to shower with antibacteria soap.  No nail polish, artificial or acrylic nails. Wear clean clothes, brush your teeth. ?Glasses, contact lens,dentures or partials may not be worn in the OR. If you need to wear them, please bring a case for glasses, do not wear contacts or bring a case, the hospital does not have contact cases, dentures or partials will have to be removed , make sure they are clean, we will provide a denture cup to put them in. You will need some one to drive you home and a responsible person over the age of 69 to stay with you for the first 24 hours after surgery.  ?

## 2021-11-27 ENCOUNTER — Encounter (HOSPITAL_COMMUNITY): Payer: Self-pay | Admitting: Orthopaedic Surgery

## 2021-11-27 ENCOUNTER — Ambulatory Visit (HOSPITAL_COMMUNITY): Payer: BC Managed Care – PPO | Admitting: Certified Registered Nurse Anesthetist

## 2021-11-27 ENCOUNTER — Ambulatory Visit (HOSPITAL_COMMUNITY)
Admission: RE | Admit: 2021-11-27 | Discharge: 2021-11-27 | Disposition: A | Discharge status: Home / Self Care | Payer: BC Managed Care – PPO | Attending: Orthopaedic Surgery | Admitting: Orthopaedic Surgery

## 2021-11-27 ENCOUNTER — Encounter (HOSPITAL_COMMUNITY)
Admission: RE | Disposition: A | Discharge status: Home / Self Care | Payer: Self-pay | Source: Home / Self Care | Attending: Orthopaedic Surgery

## 2021-11-27 ENCOUNTER — Ambulatory Visit (HOSPITAL_COMMUNITY): Payer: BC Managed Care – PPO

## 2021-11-27 DIAGNOSIS — M8788 Other osteonecrosis, other site: Secondary | ICD-10-CM | POA: Diagnosis present

## 2021-11-27 DIAGNOSIS — M87051 Idiopathic aseptic necrosis of right femur: Secondary | ICD-10-CM | POA: Diagnosis not present

## 2021-11-27 HISTORY — DX: Gastro-esophageal reflux disease without esophagitis: K21.9

## 2021-11-27 HISTORY — PX: DECOMPRESSION HIP-CORE: SHX5012

## 2021-11-27 HISTORY — DX: Personal history of other diseases of the digestive system: Z87.19

## 2021-11-27 LAB — CBC
HCT: 47.7 % (ref 39.0–52.0)
Hemoglobin: 16.8 g/dL (ref 13.0–17.0)
MCH: 31.8 pg (ref 26.0–34.0)
MCHC: 35.2 g/dL (ref 30.0–36.0)
MCV: 90.3 fL (ref 80.0–100.0)
Platelets: 227 10*3/uL (ref 150–400)
RBC: 5.28 MIL/uL (ref 4.22–5.81)
RDW: 11.9 % (ref 11.5–15.5)
WBC: 5.7 10*3/uL (ref 4.0–10.5)
nRBC: 0 % (ref 0.0–0.2)

## 2021-11-27 IMAGING — RF DG HIP (WITH OR WITHOUT PELVIS) 1V*R*
1 series · 7 of 7 positions shown · non-contrast
Comparison: MRI right hip [DATE]

CLINICAL DATA: Decompression right hip for AVN

EXAM:
DG HIP (WITH OR WITHOUT PELVIS) 1V RIGHT

[Series 1: run · 7 of 7 slices shown]
[im 1/7]
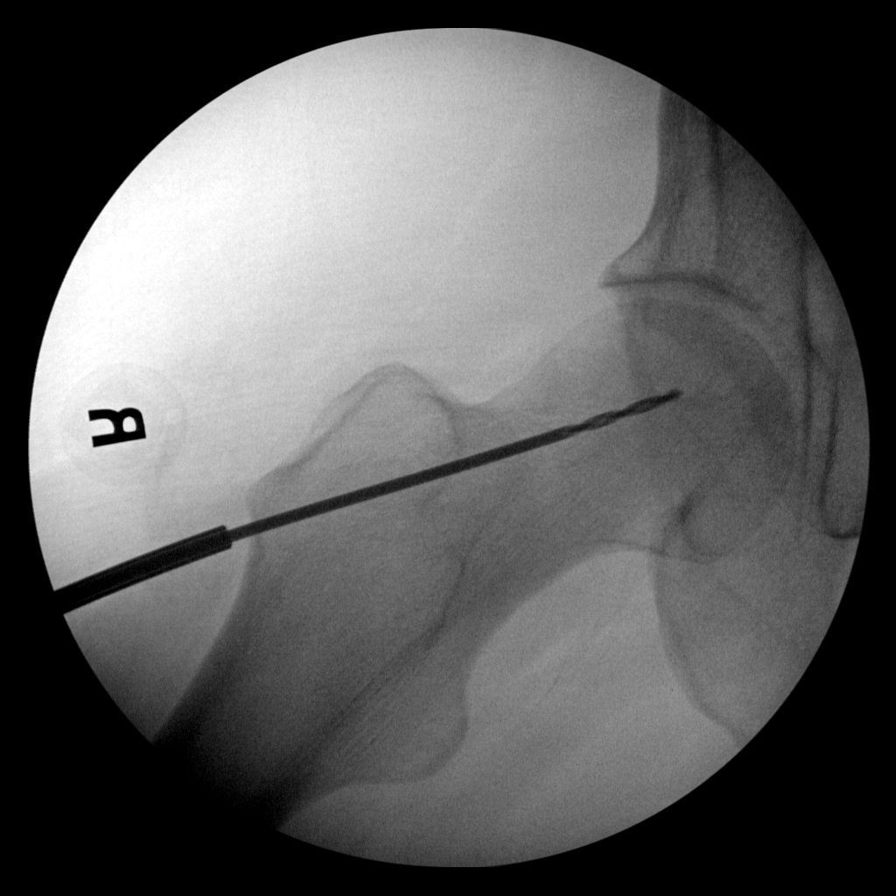
[im 2/7]
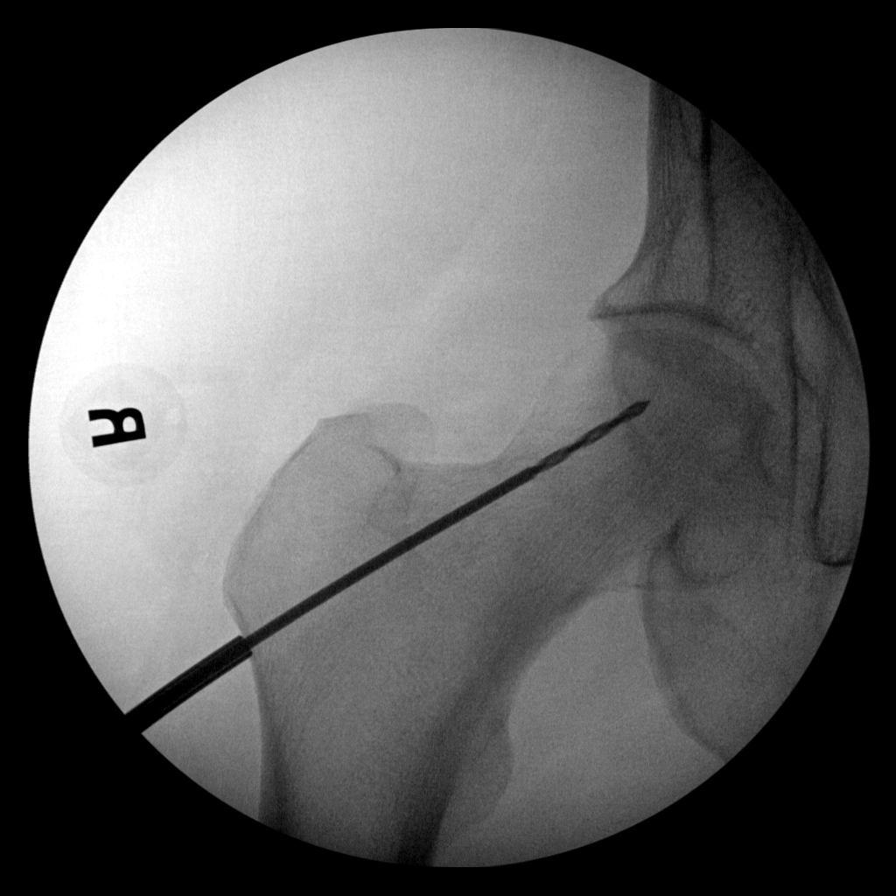
[im 3/7]
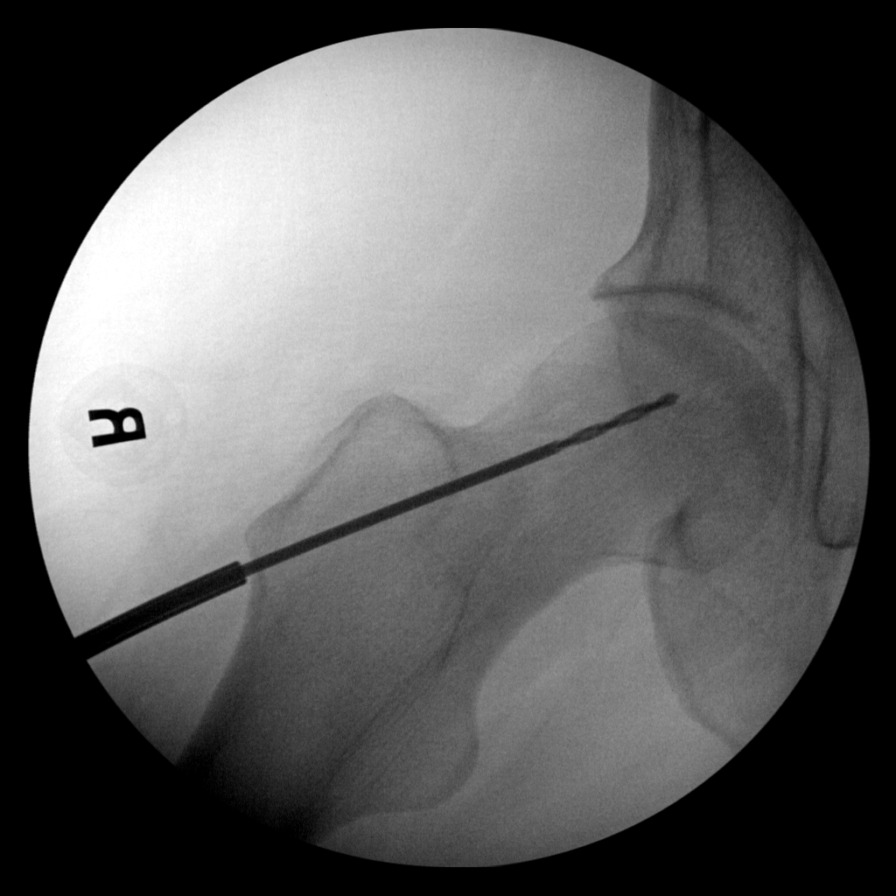
[im 4/7]
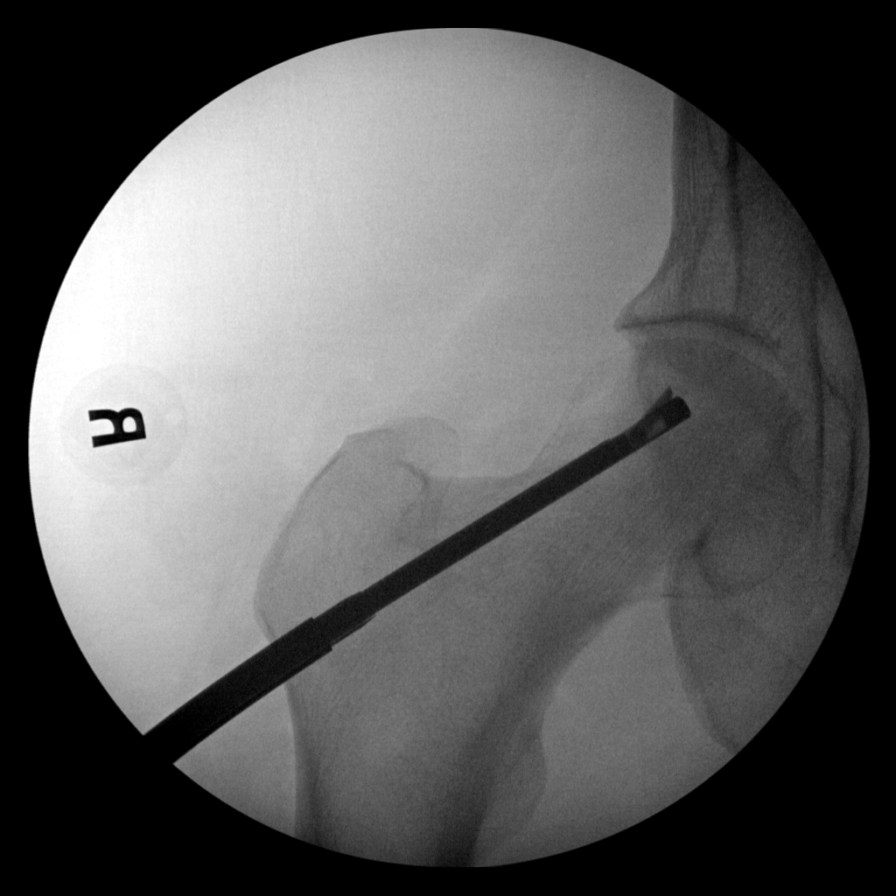
[im 5/7]
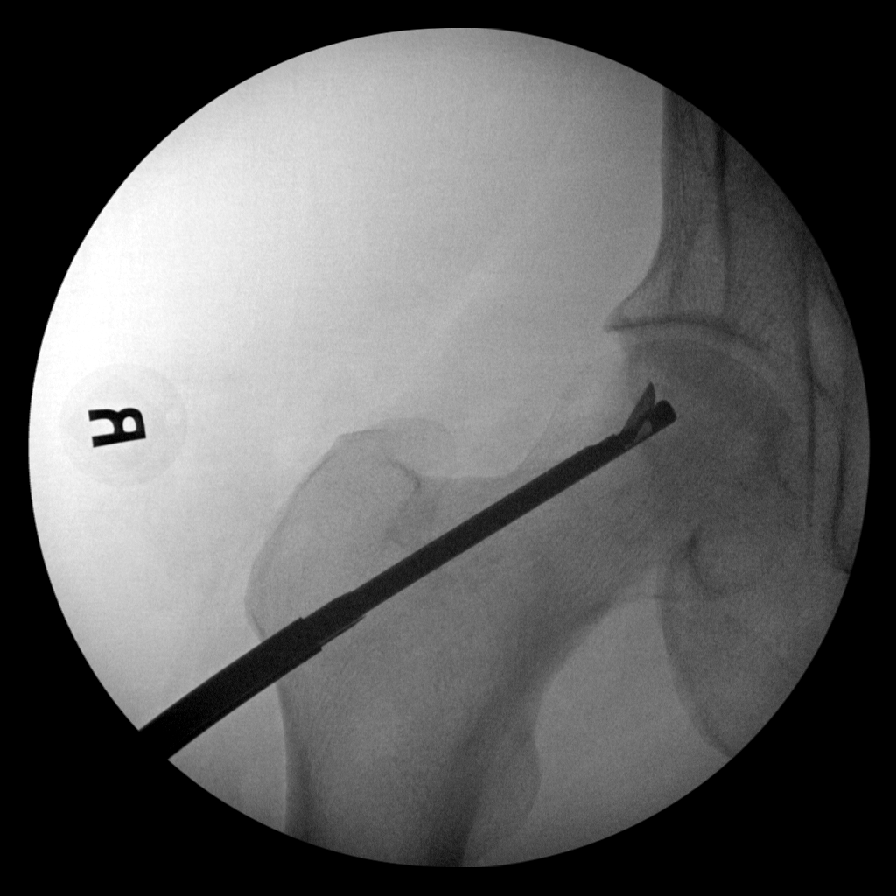
[im 6/7]
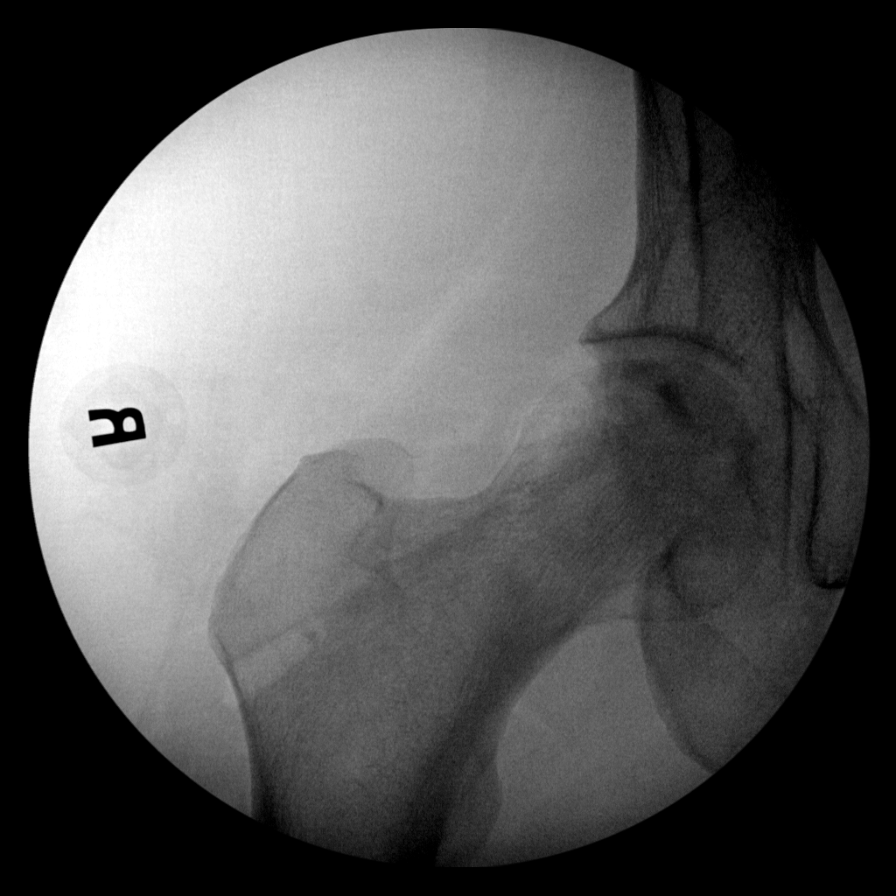
[im 7/7]
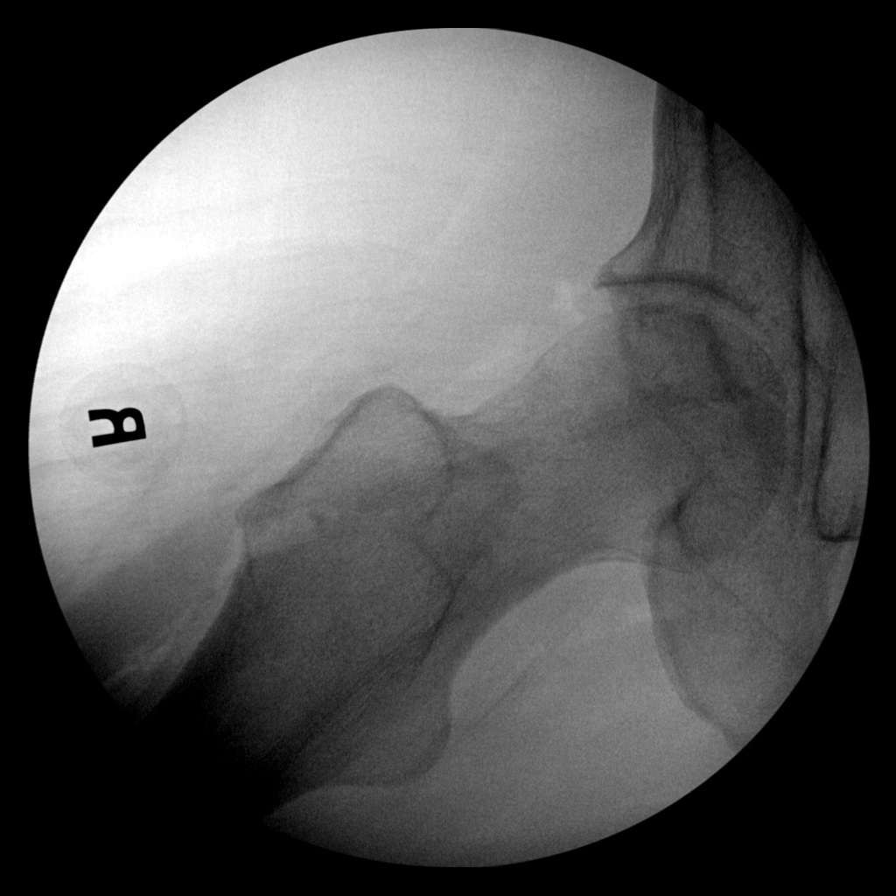

[7 of 7 positions shown; findings below may reference images not displayed]

FINDINGS: Seven C-arm images of the right hip were obtained. These reveal
decompression of the right femoral head. There is a surgical tract
extending below the greater trochanter into the femoral head. No
fracture or complication
IMPRESSION: Decompression right hip for AVN.

## 2021-11-27 SURGERY — CORE DECOMPRESSION, FEMUR, HEAD
Anesthesia: General | Site: Hip | Laterality: Right

## 2021-11-27 MED ORDER — SUGAMMADEX SODIUM 200 MG/2ML IV SOLN
INTRAVENOUS | Status: DC | PRN
  Administered 2021-11-27: 177 mg via INTRAVENOUS

## 2021-11-27 MED ORDER — CEFAZOLIN SODIUM-DEXTROSE 2-4 GM/100ML-% IV SOLN
2.0000 g | INTRAVENOUS | Status: AC
  Administered 2021-11-27: 2 g via INTRAVENOUS
  Filled 2021-11-27: qty 100

## 2021-11-27 MED ORDER — ONDANSETRON HCL 4 MG/2ML IJ SOLN
INTRAMUSCULAR | Status: DC | PRN
Start: 2021-11-27 — End: 2021-11-27
  Administered 2021-11-27: 4 mg via INTRAVENOUS

## 2021-11-27 MED ORDER — TRANEXAMIC ACID-NACL 1000-0.7 MG/100ML-% IV SOLN
1000.0000 mg | INTRAVENOUS | Status: AC
  Administered 2021-11-27: 1000 mg via INTRAVENOUS
  Filled 2021-11-27: qty 100

## 2021-11-27 MED ORDER — ACETAMINOPHEN 500 MG PO TABS
1000.0000 mg | ORAL_TABLET | Freq: Once | ORAL | Status: AC
  Administered 2021-11-27: 1000 mg via ORAL
  Filled 2021-11-27: qty 2

## 2021-11-27 MED ORDER — 0.9 % SODIUM CHLORIDE (POUR BTL) OPTIME
TOPICAL | Status: DC | PRN
  Administered 2021-11-27: 1000 mL

## 2021-11-27 MED ORDER — DEXAMETHASONE SODIUM PHOSPHATE 10 MG/ML IJ SOLN
INTRAMUSCULAR | Status: DC | PRN
  Administered 2021-11-27: 10 mg via INTRAVENOUS

## 2021-11-27 MED ORDER — HEPARIN SOD (PORK) LOCK FLUSH 100 UNIT/ML IV SOLN
INTRAVENOUS | Status: AC
  Filled 2021-11-27: qty 5

## 2021-11-27 MED ORDER — CHLORHEXIDINE GLUCONATE 0.12 % MT SOLN
15.0000 mL | OROMUCOSAL | Status: AC
  Administered 2021-11-27: 15 mL via OROMUCOSAL
  Filled 2021-11-27: qty 15

## 2021-11-27 MED ORDER — HYDROMORPHONE HCL 1 MG/ML IJ SOLN
0.2500 mg | INTRAMUSCULAR | Status: DC | PRN
  Administered 2021-11-27 (×2): 0.5 mg via INTRAVENOUS

## 2021-11-27 MED ORDER — FENTANYL CITRATE (PF) 250 MCG/5ML IJ SOLN
INTRAMUSCULAR | Status: AC
  Filled 2021-11-27: qty 5

## 2021-11-27 MED ORDER — SODIUM CHLORIDE (PF) 0.9 % IJ SOLN
INTRAMUSCULAR | Status: DC | PRN
  Administered 2021-11-27: 5 mL
  Administered 2021-11-27: 10 mL

## 2021-11-27 MED ORDER — MIDAZOLAM HCL 2 MG/2ML IJ SOLN
INTRAMUSCULAR | Status: DC | PRN
Start: 2021-11-27 — End: 2021-11-27
  Administered 2021-11-27: 2 mg via INTRAVENOUS

## 2021-11-27 MED ORDER — HEPARIN SOD (PORK) LOCK FLUSH 100 UNIT/ML IV SOLN
INTRAVENOUS | Status: DC | PRN
  Administered 2021-11-27: 500 [IU]

## 2021-11-27 MED ORDER — ROCURONIUM BROMIDE 10 MG/ML (PF) SYRINGE
PREFILLED_SYRINGE | INTRAVENOUS | Status: DC | PRN
  Administered 2021-11-27: 70 mg via INTRAVENOUS

## 2021-11-27 MED ORDER — LIDOCAINE 2% (20 MG/ML) 5 ML SYRINGE
INTRAMUSCULAR | Status: DC | PRN
  Administered 2021-11-27: 80 mg via INTRAVENOUS

## 2021-11-27 MED ORDER — MIDAZOLAM HCL 2 MG/2ML IJ SOLN
INTRAMUSCULAR | Status: AC
  Filled 2021-11-27: qty 2

## 2021-11-27 MED ORDER — FENTANYL CITRATE (PF) 250 MCG/5ML IJ SOLN
INTRAMUSCULAR | Status: DC | PRN
  Administered 2021-11-27: 100 ug via INTRAVENOUS
  Administered 2021-11-27: 50 ug via INTRAVENOUS

## 2021-11-27 MED ORDER — CELECOXIB 200 MG PO CAPS
200.0000 mg | ORAL_CAPSULE | Freq: Once | ORAL | Status: AC
  Administered 2021-11-27: 200 mg via ORAL
  Filled 2021-11-27: qty 1

## 2021-11-27 MED ORDER — GABAPENTIN 300 MG PO CAPS
300.0000 mg | ORAL_CAPSULE | Freq: Once | ORAL | Status: AC
  Administered 2021-11-27: 300 mg via ORAL
  Filled 2021-11-27: qty 1

## 2021-11-27 MED ORDER — LACTATED RINGERS IV SOLN: INTRAVENOUS | Status: DC

## 2021-11-27 MED ORDER — PROPOFOL 10 MG/ML IV BOLUS
INTRAVENOUS | Status: DC | PRN
  Administered 2021-11-27: 200 mg via INTRAVENOUS

## 2021-11-27 MED ORDER — AMISULPRIDE (ANTIEMETIC) 5 MG/2ML IV SOLN: 10.0000 mg | Freq: Once | INTRAVENOUS | Status: DC | PRN

## 2021-11-27 MED ORDER — HYDROMORPHONE HCL 1 MG/ML IJ SOLN
INTRAMUSCULAR | Status: AC
  Filled 2021-11-27: qty 1

## 2021-11-27 SURGICAL SUPPLY — 47 items
BAG COUNTER SPONGE SURGICOUNT (BAG) ×2 IMPLANT
BNDG COHESIVE 4X5 TAN STRL (GAUZE/BANDAGES/DRESSINGS) ×2 IMPLANT
BNDG GAUZE ELAST 4 BULKY (GAUZE/BANDAGES/DRESSINGS) ×2 IMPLANT
Bonesync IMPLANT
CEMENT BONESYNC FAST SET 10 (Cement) ×1 IMPLANT
COVER PERINEAL POST (MISCELLANEOUS) ×2 IMPLANT
COVER SURGICAL LIGHT HANDLE (MISCELLANEOUS) ×4 IMPLANT
DRAPE C-ARM 42X72 X-RAY (DRAPES) ×1 IMPLANT
DRAPE OEC MINIVIEW 54X84 (DRAPES) IMPLANT
DRAPE STERI IOBAN 125X83 (DRAPES) ×2 IMPLANT
DRSG ADAPTIC 3X8 NADH LF (GAUZE/BANDAGES/DRESSINGS) ×2 IMPLANT
DRSG AQUACEL AG ADV 3.5X 4 (GAUZE/BANDAGES/DRESSINGS) ×2 IMPLANT
DRSG MEPILEX BORDER 4X4 (GAUZE/BANDAGES/DRESSINGS) ×2 IMPLANT
DRSG MEPILEX BORDER 4X8 (GAUZE/BANDAGES/DRESSINGS) ×2 IMPLANT
DRSG PAD ABDOMINAL 8X10 ST (GAUZE/BANDAGES/DRESSINGS) ×2 IMPLANT
DURAPREP 26ML APPLICATOR (WOUND CARE) ×2 IMPLANT
ELECT REM PT RETURN 9FT ADLT (ELECTROSURGICAL) ×2
ELECTRODE REM PT RTRN 9FT ADLT (ELECTROSURGICAL) ×1 IMPLANT
FILTER STRAW FLUID ASPIR (MISCELLANEOUS) ×1 IMPLANT
GLOVE SURG ORTHO LTX SZ9 (GLOVE) ×2 IMPLANT
GLOVE SURG UNDER POLY LF SZ9 (GLOVE) ×2 IMPLANT
GOWN STRL REUS W/ TWL XL LVL3 (GOWN DISPOSABLE) ×3 IMPLANT
GOWN STRL REUS W/TWL XL LVL3 (GOWN DISPOSABLE) ×3
KIT BASIN OR (CUSTOM PROCEDURE TRAY) ×2 IMPLANT
KIT BONE MRW ASP ANGEL CPRP (KITS) ×1 IMPLANT
KIT GRAFT DELIVERY BIOEXPRESS (KITS) ×1 IMPLANT
KIT HIP AVN DISP (KITS) ×1 IMPLANT
KIT TURNOVER KIT B (KITS) ×2 IMPLANT
MANIFOLD NEPTUNE II (INSTRUMENTS) ×2 IMPLANT
NDL FILTER BLUNT 18X1 1/2 (NEEDLE) IMPLANT
NEEDLE FILTER BLUNT 18X 1/2SAF (NEEDLE) ×1
NEEDLE FILTER BLUNT 18X1 1/2 (NEEDLE) ×1 IMPLANT
NS IRRIG 1000ML POUR BTL (IV SOLUTION) ×2 IMPLANT
PACK GENERAL/GYN (CUSTOM PROCEDURE TRAY) ×2 IMPLANT
PAD ARMBOARD 7.5X6 YLW CONV (MISCELLANEOUS) ×4 IMPLANT
PUTTY DBM ALLOSYNC PURE 10CC (Putty) ×1 IMPLANT
STAPLER VISISTAT 35W (STAPLE) ×2 IMPLANT
STOPCOCK 4 WAY LG BORE MALE ST (IV SETS) ×1 IMPLANT
SUT ETHILON 3 0 PS 1 (SUTURE) ×1 IMPLANT
SUT VIC AB 1 CTB1 27 (SUTURE) ×2 IMPLANT
SUT VIC AB 2-0 CT1 27 (SUTURE) ×1
SUT VIC AB 2-0 CT1 TAPERPNT 27 (SUTURE) IMPLANT
SUT VIC AB 2-0 CTB1 (SUTURE) ×2 IMPLANT
SYR TB 1ML LUER SLIP (SYRINGE) ×1 IMPLANT
TOWEL GREEN STERILE (TOWEL DISPOSABLE) ×2 IMPLANT
TOWEL GREEN STERILE FF (TOWEL DISPOSABLE) ×2 IMPLANT
WATER STERILE IRR 1000ML POUR (IV SOLUTION) ×2 IMPLANT

## 2021-11-27 NOTE — Anesthesia Procedure Notes (Signed)
Procedure Name: Intubation ?Date/Time: 11/27/2021 1:05 PM ?Performed by: Minerva Ends, CRNA ?Pre-anesthesia Checklist: Patient identified, Emergency Drugs available, Suction available and Patient being monitored ?Patient Re-evaluated:Patient Re-evaluated prior to induction ?Oxygen Delivery Method: Circle system utilized ?Preoxygenation: Pre-oxygenation with 100% oxygen ?Induction Type: IV induction ?Ventilation: Mask ventilation without difficulty ?Laryngoscope Size: Mac, 3 and Glidescope ?Tube type: Oral ?Tube size: 7.0 mm ?Number of attempts: 2 ?Airway Equipment and Method: Stylet and Oral airway ?Placement Confirmation: ETT inserted through vocal cords under direct vision, positive ETCO2 and breath sounds checked- equal and bilateral ?Secured at: 23 cm ?Tube secured with: Tape ?Dental Injury: Teeth and Oropharynx as per pre-operative assessment  ?Difficulty Due To: Difficult Airway- due to limited oral opening and Difficult Airway- due to anterior larynx ?Comments: Attempt 1: unsuccessful dl mac 3 grade 3 view ?Attempt 2: successful mac 3 glidescope intubation grade 1 view ? ? ? ? ?

## 2021-11-27 NOTE — Brief Op Note (Signed)
? ?  Brief Op Note ? ?Date of Surgery: ?11/27/2021 ? ?Preoperative Diagnosis: ?RIGHT HIP AVASCULAR NEUROSIS ? ?Postoperative Diagnosis: ?same ? ?Procedure: ?Procedure(s): ?RIGHT DECOMPRESSION HIP - CORE WITH BONE MARROW ASPIRATION ? ?Implants: ?Implant Name Type Inv. Item Serial No. Manufacturer Lot No. LRB No. Used Action  ?PUTTY DBM ALLOSYNC PURE 10CC - [id number] Putty PUTTY DBM ALLOSYNC PURE 10CC [id number] [employer]  Right 1 Implanted  ? ? ?Surgeons: ?Surgeon(s): ?[name], MD ? ?Anesthesia: ?Choice ? ? ? ?Estimated Blood Loss: ?See anesthesia record ? ?Complications: ?None ? ?Condition to PACU: ?Stable ? ?Benancio Deeds, MD ?11/27/2021 ?2:14 PM ? ?

## 2021-11-27 NOTE — Discharge Instructions (Signed)
? ? ?   Discharge Instructions  ? ? ?Attending Surgeon: [name], MD ?Office Phone Number: [phone] ? ? ?Diagnosis and Procedures:   ? ?Surgeries Performed: ?Right hip core decompression ? ?Discharge Plan:  ? ? ?Diet: ?Resume usual diet. Begin with light or bland foods.  Drink plenty of fluids. ? ?Activity:  ?Non-weightbearing, utilizing crutches, until seen at postoperative Physical Therapy visit this week. Please keep your brace locked until follow-up. ?You are advised to go home directly from the hospital or surgical center. Restrict your activities. ? ?GENERAL INSTRUCTIONS: ?1.  Keep your surgical site elevated above your heart for at least 5-7 days or longer to prevent swelling. This will improve your comfort and your overall recovery following surgery.   ?  ?2. Please call Dr. Serena Croissant office at [phone] with questions Monday-Friday during business hours. If no one answers, please leave a message and someone should get back to the patient within 24 hours. For emergencies please call 911 or proceed to the emergency room.  ? ?3. Patient to notify surgical team if experiences any of the following: Bowel/Bladder dysfunction, uncontrolled pain, nerve/muscle weakness, incision with increased drainage or redness, nausea/vomiting and Fever greater than 101.0 F.  Be alert for signs of infection including redness, streaking, odor, fever or chills. Be alert for excessive pain or bleeding and notify your surgeon immediately. ? ?WOUND INSTRUCTIONS:   ?Leave your dressing/cast/splint in place until your post operative visit.  Keep it clean and dry. ? ?Always keep the incision clean and dry until the staples/sutures are removed. If there is no drainage from the incision you should keep it open to air. If there is drainage from the incision you must keep it covered at all times until the drainage stops ? Do not soak in a bath tub, hot tub, pool, lake or other body of water until 21 days after your surgery and  your incision is completely dry and healed.  ?If you have removable sutures (or staples) they must be removed 10-14 days (unless otherwise instructed) from the day of your surgery.  ? ? ? 1)  Elevate the extremity as much as possible. ? 2)  Keep the dressing clean and dry. ? 3)  Please call us if the dressing becomes wet or dirty. ? 4)  If you are experiencing worsening pain or worsening swelling, please call. ?  ?  ?MEDICATIONS: ?Resume all previous home medications at the previous prescribed dose and frequency unless otherwise noted ?Start taking the  pain medications on an as-needed basis as prescribed  ?Please taper down pain medication over the next week following surgery.  Ideally you should not require a refill of any narcotic pain medication.  ?Take pain medication with food to minimize nausea. ?In addition to the prescribed pain medication, you may take over-the-counter pain relievers such as Tylenol.  Do NOT take additional tylenol if your pain medication already has tylenol in it.  ?Aspirin 325mg  daily for four weeks. ? ?  ?  ?FOLLOWUP INSTRUCTIONS: ?1. Follow up at the Physical Therapy Clinic 3-4 days following surgery. This appointment should be scheduled unless other arrangements have been made.The Physical Therapy scheduling number is [phone] if an appointment has not already been arranged. ? ?2. Contact Dr. [phone] office during office hours at [phone] or the practice after hours line at [phone] for non-emergencies. For medical emergencies call 911. ? ? ?Discharge Location: Home  ?

## 2021-11-27 NOTE — Op Note (Signed)
? ?Date of Surgery: 11/27/2021 ? ?INDICATIONS: [name] is a 46 y.o.-year-old male with persistently symptomatic avascular necrosis which has failed physical therapy as well as medical management following a femoral neck stress fracture.  The risk and benefits of the procedure were discussed in detail and documented in the pre-operative evaluation.  ? ?PREOPERATIVE DIAGNOSIS: 1.  Right hip avascular necrosis ? ?POSTOPERATIVE DIAGNOSIS: Same. ? ?PROCEDURE: 1.  Right hip core decompression with bone marrow aspirate concentrate ?2.  Right hip iliac crest bone marrow aspiration and harvest ? ?SURGEON: Yevonne Pax MD ? ?ASSISTANT: Raynelle Fanning, ATC ? ?ANESTHESIA: General ? ?IV FLUIDS AND URINE: See anesthesia record. ? ?ANTIBIOTICS: Ancef 2 g ? ?ESTIMATED BLOOD LOSS: 10 mL. ? ?IMPLANTS:  ?Implant Name Type Inv. Item Serial No. Manufacturer Lot No. LRB No. Used Action  ?PUTTY DBM ALLOSYNC PURE 10CC - [id number] Putty PUTTY DBM ALLOSYNC PURE 10CC [id number] [employer]  Right 1 Implanted  ? ? ?DRAINS: None ? ?CULTURES: None ? ?COMPLICATIONS: none ? ?DESCRIPTION OF PROCEDURE:  ?The patient was identified in the preoperative holding area.  The correct site was marked according to universal protocol with nursing.  He was subsequently taken back to the operating room. ? ?In the operating room anesthesia was induced.  He was placed on a radiolucent table in the supine position.  All bony prominences were padded.  A bump was placed under the right hip.  At this time fluoroscopy was taken to confirm that the lesion can be visualized on fluoroscopic views.  He was prepped and draped in the usual sterile fashion.  Ancef was given 1 hour prior to skin incision.  Final timeout was again performed.  We started with iliac crest bone marrow harvest.  15 blade was used to incise through skin over the iliac crest 5 cm posterior to the ASIS.  This was taken just through skin.  A snap was then introduced in order to spread  down to bone.  Care was taken to identify the inner and outer wall of the iliac crest as to stay within this confines.  A Jamshidi needle was then introduced into the iliac crest and malleted 3 cm into place.  At this time we are able to draw back a little over 40 cc of bone marrow.  We are happy with this.  The Jamshidi needle was removed.  The wound was closed with 3-0 nylon. ? ?At this time attention was turned to the hip.  A guidewire was used over the anterior aspect of the hip in order to mark out the incision laterally.  This was made at the midportion of the femur.  15 blade was used to incise through skin laterally and then down and through IT band.  Electrocautery was used to achieve hemostasis.  A Cobb elevator was then introduced in order to elevate off the vastus lateralis off of the anterior femur.  At this time the trocar as well as the starting wire were introduced into the lateral aspect of the femur and the guidewire was placed up through the femoral neck at the central portion of the defect which was confirmed on AP and lateral fluoroscopy.  The outer step guide was then [facility] into place.  At this time the 5 mm reamer was then introduced into the bed of the AVN defect.  The expandable reamer was subsequently expanded 2 mm at a time up to 18 mm.  This was remained quite easily in the defect.  While this  was happening the bone marrow was working in the centrifuge and subsequently combined with DBM putty.  The introduction sheath was then placed to the step guide and this was injected into the bed of the AVN defect.  The tamp was used to backfill this and ensure all BMAC was in the bed of the AVN.  At this time calcium phosphate cement was back introduced as the introduction sheath was being taken out.  Fluoroscopy showed good backfill of the defect with calcium phosphate cement.  We are happy with the placement of the B MAC on AP and lateral fluoroscopy.  The wounds were thoroughly irrigated and  closed in layers of 2-0 Vicryl and 3-0 nylon.  Dressings were placed with Aquacel over the bone marrow harvest site as well as lateral hip.  All counts were correct at the end of the case.  He was awoken taken the PACU without complication. ? ? ? ?POSTOPERATIVE PLAN: He will be nonweightbearing for a total of 2 weeks on the right leg.  At that time I will plan to progress his weightbearing.  I will see him back in 2 weeks for x-rays and suture removal. ? ?Yevonne Pax, MD ?2:15 PM ? ? ? ?

## 2021-11-27 NOTE — Anesthesia Preprocedure Evaluation (Addendum)
Anesthesia Evaluation  ?Patient identified by MRN, date of birth, ID band ?Patient awake ? ? ? ?Reviewed: ?Allergy & Precautions, NPO status , Patient's Chart, lab work & pertinent test results ? ?Airway ?Mallampati: II ? ?TM Distance: >3 FB ?Neck ROM: Full ? ? ? Dental ?no notable dental hx. ?(+) Dental Advisory Given, Teeth Intact ?  ?Pulmonary ?neg pulmonary ROS,  ?  ?Pulmonary exam normal ?breath sounds clear to auscultation ? ? ? ? ? ? Cardiovascular ?negative cardio ROS ?Normal cardiovascular exam ?Rhythm:Regular Rate:Normal ? ? ?  ?Neuro/Psych ?PSYCHIATRIC DISORDERS Anxiety   ? GI/Hepatic ?Neg liver ROS, hiatal hernia, GERD  Medicated,  ?Endo/Other  ?negative endocrine ROS ? Renal/GU ?negative Renal ROS  ? ?  ?Musculoskeletal ?negative musculoskeletal ROS ?(+)  ? Abdominal ?  ?Peds ? Hematology ?negative hematology ROS ?(+)   ?Anesthesia Other Findings ? ? Reproductive/Obstetrics ? ?  ? ? ? ? ? ? ? ? ? ? ? ? ? ?  ?  ? ? ? ? ? ? ?Anesthesia Physical ?Anesthesia Plan ? ?ASA: 2 ? ?Anesthesia Plan: General  ? ?Post-op Pain Management: Tylenol PO (pre-op)*, Celebrex PO (pre-op)*, Dilaudid IV and Gabapentin PO (pre-op)*  ? ?Induction: Intravenous ? ?PONV Risk Score and Plan: 3 and Ondansetron, Treatment may vary due to age or medical condition, Midazolam and Dexamethasone ? ?Airway Management Planned: Oral ETT ? ?Additional Equipment: None ? ?Intra-op Plan:  ? ?Post-operative Plan:  ? ?Informed Consent: I have reviewed the patients History and Physical, chart, labs and discussed the procedure including the risks, benefits and alternatives for the proposed anesthesia with the patient or authorized representative who has indicated his/her understanding and acceptance.  ? ? ? ?Dental advisory given ? ?Plan Discussed with: CRNA ? ?Anesthesia Plan Comments:   ? ? ? ? ?Anesthesia Quick Evaluation ? ?

## 2021-11-27 NOTE — Interval H&P Note (Signed)
History and Physical Interval Note: ? ?11/27/2021 ?12:11 PM ? ?Cameron Joyce  has presented today for surgery, with the diagnosis of RIGHT HIP AVASCULAR NEUROSIS.  The various methods of treatment have been discussed with the patient and family. After consideration of risks, benefits and other options for treatment, the patient has consented to  Procedure(s): ?RIGHT DECOMPRESSION HIP - CORE WITH BONE MARROW ASPIRATION (Right) as a surgical intervention.  The patient's history has been reviewed, patient examined, no change in status, stable for surgery.  I have reviewed the patient's chart and labs.  Questions were answered to the patient's satisfaction.   ? ? ?[name] ? ? ?

## 2021-11-27 NOTE — Transfer of Care (Signed)
Immediate Anesthesia Transfer of Care Note ? ?Patient: Cameron Joyce ? ?Procedure(s) Performed: RIGHT DECOMPRESSION HIP - CORE WITH BONE MARROW ASPIRATION (Right: Hip) ? ?Patient Location: PACU ? ?Anesthesia Type:General ? ?Level of Consciousness: awake, alert  and oriented ? ?Airway & Oxygen Therapy: Patient Spontanous Breathing ? ?Post-op Assessment: Report given to RN and Post -op Vital signs reviewed and stable ? ?Post vital signs: Reviewed and stable ? ?Last Vitals:  ?Vitals Value Taken Time  ?BP 135/90 11/27/21 1419  ?Temp    ?Pulse 64 11/27/21 1420  ?Resp 19 11/27/21 1420  ?SpO2 99 % 11/27/21 1420  ?Vitals shown include unvalidated device data. ? ?Last Pain:  ?Vitals:  ? 11/27/21 1135  ?TempSrc: Oral  ?PainSc: 0-No pain  ?   ? ?Patients Stated Pain Goal: 0 (11/27/21 1135) ? ?Complications: No notable events documented. ?

## 2021-11-28 ENCOUNTER — Other Ambulatory Visit (HOSPITAL_BASED_OUTPATIENT_CLINIC_OR_DEPARTMENT_OTHER): Payer: Self-pay | Admitting: Orthopaedic Surgery

## 2021-11-28 DIAGNOSIS — M87051 Idiopathic aseptic necrosis of right femur: Secondary | ICD-10-CM

## 2021-11-28 NOTE — Anesthesia Postprocedure Evaluation (Signed)
Anesthesia Post Note ? ?Patient: Cameron Joyce ? ?Procedure(s) Performed: RIGHT DECOMPRESSION HIP - CORE WITH BONE MARROW ASPIRATION (Right: Hip) ? ?  ? ?Patient location during evaluation: PACU ?Anesthesia Type: General ?Level of consciousness: sedated and patient cooperative ?Pain management: pain level controlled ?Vital Signs Assessment: post-procedure vital signs reviewed and stable ?Respiratory status: spontaneous breathing ?Cardiovascular status: stable ?Anesthetic complications: no ? ? ?No notable events documented. ? ?Last Vitals:  ?Vitals:  ? 11/27/21 1450 11/27/21 1505  ?BP: 129/84 130/90  ?Pulse: 62 62  ?Resp: 17 15  ?Temp:  36.7 ?C  ?SpO2: 100% 100%  ?  ?Last Pain:  ?Vitals:  ? 11/27/21 1450  ?TempSrc:   ?PainSc: 4   ? ? ?  ?  ?  ?  ?  ?  ? ?Lewie Loron ? ? ? ? ?

## 2021-11-30 ENCOUNTER — Encounter (HOSPITAL_COMMUNITY): Payer: Self-pay | Admitting: Orthopaedic Surgery

## 2021-12-10 ENCOUNTER — Ambulatory Visit (INDEPENDENT_AMBULATORY_CARE_PROVIDER_SITE_OTHER): Payer: BC Managed Care – PPO | Admitting: *Deleted

## 2021-12-10 DIAGNOSIS — J309 Allergic rhinitis, unspecified: Secondary | ICD-10-CM

## 2021-12-12 ENCOUNTER — Ambulatory Visit (HOSPITAL_BASED_OUTPATIENT_CLINIC_OR_DEPARTMENT_OTHER): Payer: BC Managed Care – PPO | Attending: Orthopaedic Surgery | Admitting: Physical Therapy

## 2021-12-12 ENCOUNTER — Encounter (HOSPITAL_BASED_OUTPATIENT_CLINIC_OR_DEPARTMENT_OTHER): Payer: Self-pay | Admitting: Physical Therapy

## 2021-12-12 ENCOUNTER — Encounter (HOSPITAL_BASED_OUTPATIENT_CLINIC_OR_DEPARTMENT_OTHER): Payer: BC Managed Care – PPO | Admitting: Orthopaedic Surgery

## 2021-12-12 DIAGNOSIS — M25652 Stiffness of left hip, not elsewhere classified: Secondary | ICD-10-CM | POA: Diagnosis not present

## 2021-12-12 DIAGNOSIS — M25552 Pain in left hip: Secondary | ICD-10-CM | POA: Insufficient documentation

## 2021-12-12 DIAGNOSIS — M87051 Idiopathic aseptic necrosis of right femur: Secondary | ICD-10-CM | POA: Diagnosis present

## 2021-12-12 DIAGNOSIS — R2689 Other abnormalities of gait and mobility: Secondary | ICD-10-CM | POA: Diagnosis not present

## 2021-12-12 NOTE — Therapy (Signed)
?OUTPATIENT PHYSICAL THERAPY LOWER EXTREMITY EVALUATION ? ? ?Patient Name: Cameron Joyce ?MRN: [id number] ?DOB:Sep 08, 1975, 46 y.o., male ?Today's Date: 12/12/2021 ? ? ? ?Past Medical History:  ?Diagnosis Date  ? Allergy   ? Anxiety   ? GERD (gastroesophageal reflux disease)   ? History of hiatal hernia   ? Hyperlipidemia   ? ?Past Surgical History:  ?Procedure Laterality Date  ? COLONOSCOPY  01/2017  ? DECOMPRESSION HIP-CORE Right 11/27/2021  ? Procedure: RIGHT DECOMPRESSION HIP - CORE WITH BONE MARROW ASPIRATION;  Surgeon: Vanetta Mulders, MD;  Location: [facility];  Service: Orthopedics;  Laterality: Right;  ? ESOPHAGOGASTRODUODENOSCOPY  01/2017  ? hiatal hernia  ? NASAL SEPTUM SURGERY  2018  ? SEPTOPLASTY    ? & turbinate reduction  ? ?Patient Active Problem List  ? Diagnosis Date Noted  ? Avascular necrosis of right femur ([place])   ? Pain in right hip 11/06/2021  ? Need for Tdap vaccination 02/08/2021  ? History of hip fracture 02/08/2021  ? Chronic right hip pain 07/03/2020  ? Encounter for health maintenance examination in adult 11/05/2019  ? Gastroesophageal reflux disease 11/05/2019  ? Hiatal hernia 11/05/2019  ? Allergic rhinitis due to pollen 11/05/2019  ? Hyperlipidemia 11/05/2019  ? Work stress 11/05/2019  ? Vaccine counseling 11/05/2019  ? Seasonal and perennial allergic rhinitis 11/01/2019  ? Allergic conjunctivitis 11/01/2019  ? ? ?PCP: Carlena Hurl, PA-C ? ?REFERRING PROVIDER: Vanetta Mulders, MD ? ?REFERRING DIAG: Left Hip Avascular Necrosis and core decompression.  ? ?THERAPY DIAG:  ?No diagnosis found. ? ?ONSET DATE: 11/27/2021 decompression  ? ?SUBJECTIVE:  ? ?SUBJECTIVE STATEMENT: ?The patient had a right hip core decompression on 4/11/20223. At this time he is WBAT on crutches. He has been working on progressive weight being at home.  ? ?PERTINENT HISTORY: ? ? ?PAIN:  ?Are you having pain? Yes: NPRS scale: 1/10 just feels it from time to time  ?Pain location: anterior hip  ?Pain description: ache   ?Aggravating factors: when he tries to cross his legs  ?Relieving factors: not doing that  ? ?PRECAUTIONS: None ? ?WEIGHT BEARING RESTRICTIONS Yes WBAT ? ?FALLS:  ?Has patient fallen in last 6 months? No ? ?LIVING ENVIRONMENT: ?Lives with: 2 steps in the front door  ? ?OCCUPATION: band director  ? ? ?PLOF: Independent ? ?PATIENT GOALS  ?To have less pain  ? ? ?OBJECTIVE:  ? ?DIAGNOSTIC FINDINGS:  ? ?PATIENT SURVEYS:  ?FOTO   ? ?COGNITION: ? Overall cognitive status: Within functional limits for tasks assessed   ?  ?SENSATION: ?WFL ? ? ? ?POSTURE:  ?good ? ?PALPATION: ?No unexpted TTP  ? ?LE ROM: ? ?Active ROM Right ?12/12/2021 Left ?12/12/2021  ?Hip flexion 90 with mild pain  Full   ?Hip extension    ?Hip abduction    ?Hip adduction    ?Hip internal rotation Mild pain but full range    ?Hip external rotation 30 degrees not pushed to end range    ?Knee flexion    ?Knee extension    ?Ankle dorsiflexion    ?Ankle plantarflexion    ?Ankle inversion    ?Ankle eversion    ? (Blank rows = not tested) ? ?LE MMT: ? ?MMT Right ?12/12/2021 Left ?12/12/2021  ?Hip flexion 11.9 46.8  ?Hip extension    ?Hip abduction 36.0 42.1  ?Hip adduction    ?Hip internal rotation    ?Hip external rotation    ?Knee flexion    ?Knee extension 46.1 42.1  ?Ankle dorsiflexion    ?  Ankle plantarflexion    ?Ankle inversion    ?Ankle eversion    ? (Blank rows = not tested) ( advised to give as much resistance as he could pain free ? ? ?GAIT: ?Walked without crutches with no major gait deviations ? ? ?TODAY'S TREATMENT: ? ?Exercises ?- Supine Lower Trunk Rotation  - 2 x daily - 7 x weekly - 3 sets - 10 reps ?- Heel Raises with Counter Support  - 2 x daily - 7 x weekly - 3 sets - 10 reps ?- Hip and Knee Extension and Flexion Caregiver PROM  - 2 x daily - 7 x weekly - 3 sets - 5 reps - 10 sec  hold ?- Standing March with Counter Support  - 2 x daily - 7 x weekly - 2 sets - 10 reps ? ? ?PATIENT EDUCATION:  ?Education details: reviewed weaning program for the  crutches; reviewed HEP and symptom management  ?Person educated: Patient ?Education method: Explanation, Demonstration, Tactile cues, Verbal cues, and Handouts ?Education comprehension: verbalized understanding, returned demonstration, verbal cues required, tactile cues required, and needs further education ? ? ?HOME EXERCISE PROGRAM: ?Access Code: [email or web address] ?URL: [email or web address] ?Date: 12/13/2021 ?Prepared by: Lorayne Bender ? ?Exercises ?- Supine Lower Trunk Rotation  - 2 x daily - 7 x weekly - 3 sets - 10 reps ?- Heel Raises with Counter Support  - 2 x daily - 7 x weekly - 3 sets - 10 reps ?- Hip and Knee Extension and Flexion Caregiver PROM  - 2 x daily - 7 x weekly - 3 sets - 5 reps - 10 sec  hold ?- Standing March with Counter Support  - 2 x daily - 7 x weekly - 2 sets - 10 reps ? ?ASSESSMENT: ? ?CLINICAL IMPRESSION: ?Patient is a 46 y.o. male who was seen today for physical therapy evaluation and treatment for following a femoral head core decompression on 11/27/2021. He presents with expected limitations in motion and strength. He is doing very well at this time. He has minimal pain and feels like he is walking better then before the surgery. He was advised to still progress things slowly because he is early in the process. We will advance weight bearing as tolerated.  ? ? ? ?OBJECTIVE IMPAIRMENTS Abnormal gait, decreased activity tolerance, decreased endurance, difficulty walking, decreased ROM, decreased strength, impaired flexibility, and pain.  ? ?ACTIVITY LIMITATIONS cleaning, community activity, meal prep, occupation, and shopping.  ? ?PERSONAL FACTORS None  ? ? ?REHAB POTENTIAL: Excellent ? ?CLINICAL DECISION MAKING: Stable/uncomplicated ? ?EVALUATION COMPLEXITY: Low ? ? ?GOALS: ?Goals reviewed with patient? Yes ? ?SHORT TERM GOALS: Target date: 01/10/2022 ? ?Patient will demonstrate full active hip flexion  ?Baseline: ?Goal status: INITIAL ? ?2.  Patient will ambulate 1/2 mile without  the crutches with no pain  ?Baseline:  ?Goal status: INITIAL ? ?3.  Patient will increase gross right LE strength by 5 lbs ?Baseline:  ?Goal status: INITIAL ? ? ?LONG TERM GOALS: Target date: 02/07/2022 ? ?Patient will ambulate 2 miles for exercises without pain in order to exercises  ?Baseline:  ?Goal status: INITIAL ? ?2.  Patient will stand for 1 hour without pain in order to perform ADL's  ?Baseline:  ?Goal status: INITIAL ? ?3.  Patient will sit at work for 2 hours without pain  ?Baseline:  ?Goal status: INITIAL ? ? ? ? ?PLAN: ?PT FREQUENCY: 1-2x/week ? ?PT DURATION: 8 weeks ? ?PLANNED INTERVENTIONS: Therapeutic exercises, Therapeutic activity, Neuromuscular re-education, Balance training, Gait training,  Patient/Family education, Joint mobilization, DME instructions, Aquatic Therapy, Dry Needling, Electrical stimulation, Taping, Ultrasound, Ionotophoresis 4mg /ml Dexamethasone, and Manual therapy ? ?PLAN FOR NEXT SESSION: continue with light ROM; continue with progressive weight bearing. Progress to the pool if needed.  ? ? ?Carney Living, PT ?12/12/2021, 4:55 PM  ?

## 2021-12-13 ENCOUNTER — Encounter (HOSPITAL_BASED_OUTPATIENT_CLINIC_OR_DEPARTMENT_OTHER): Payer: Self-pay

## 2021-12-13 ENCOUNTER — Ambulatory Visit (INDEPENDENT_AMBULATORY_CARE_PROVIDER_SITE_OTHER): Payer: BC Managed Care – PPO | Admitting: Orthopaedic Surgery

## 2021-12-13 ENCOUNTER — Other Ambulatory Visit (HOSPITAL_BASED_OUTPATIENT_CLINIC_OR_DEPARTMENT_OTHER): Payer: Self-pay | Admitting: Orthopaedic Surgery

## 2021-12-13 ENCOUNTER — Ambulatory Visit (HOSPITAL_COMMUNITY)
Admission: RE | Admit: 2021-12-13 | Discharge: 2021-12-13 | Disposition: A | Discharge status: Home / Self Care | Payer: BC Managed Care – PPO | Source: Ambulatory Visit | Attending: Orthopaedic Surgery | Admitting: Orthopaedic Surgery

## 2021-12-13 ENCOUNTER — Encounter (HOSPITAL_BASED_OUTPATIENT_CLINIC_OR_DEPARTMENT_OTHER): Payer: Self-pay | Admitting: Physical Therapy

## 2021-12-13 ENCOUNTER — Ambulatory Visit (HOSPITAL_BASED_OUTPATIENT_CLINIC_OR_DEPARTMENT_OTHER)
Admission: RE | Admit: 2021-12-13 | Discharge: 2021-12-13 | Disposition: A | Discharge status: Home / Self Care | Payer: BC Managed Care – PPO | Source: Ambulatory Visit | Attending: Orthopaedic Surgery | Admitting: Orthopaedic Surgery

## 2021-12-13 DIAGNOSIS — M87051 Idiopathic aseptic necrosis of right femur: Secondary | ICD-10-CM | POA: Diagnosis present

## 2021-12-13 IMAGING — DX DG HIP (WITH OR WITHOUT PELVIS) 4+V*R*
3 series · 3 of 3 positions shown · non-contrast
Comparison: Hip radiograph [DATE].  MRI [DATE]

CLINICAL DATA: Postop hip core decompression. Avascular necrosis of
right femur.

EXAM:
DG HIP (WITH OR WITHOUT PELVIS) 4+V RIGHT

[pelvis ap]
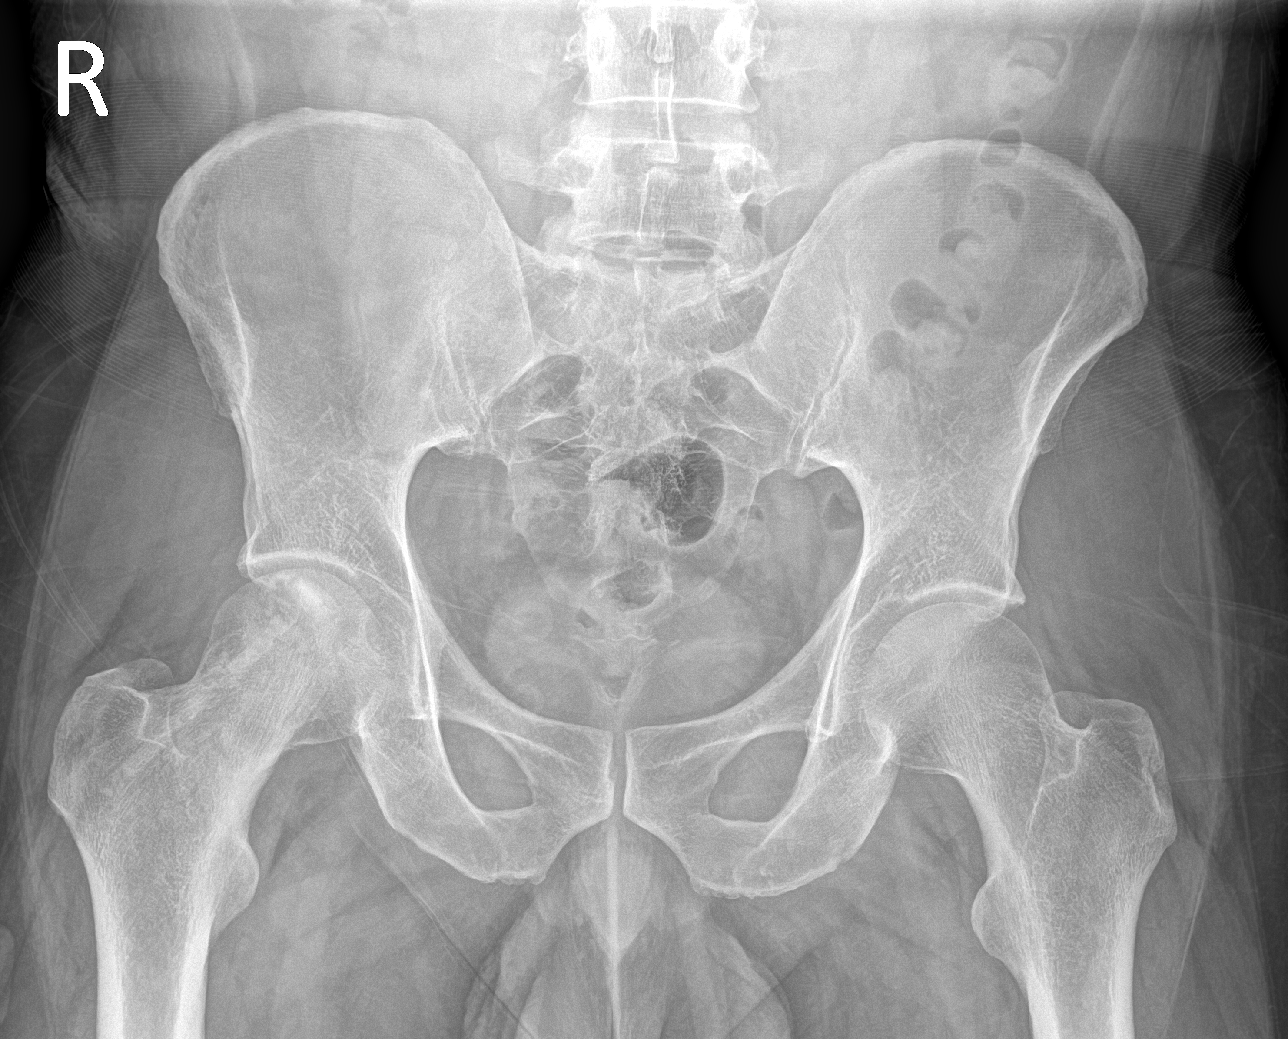

[hip ap]
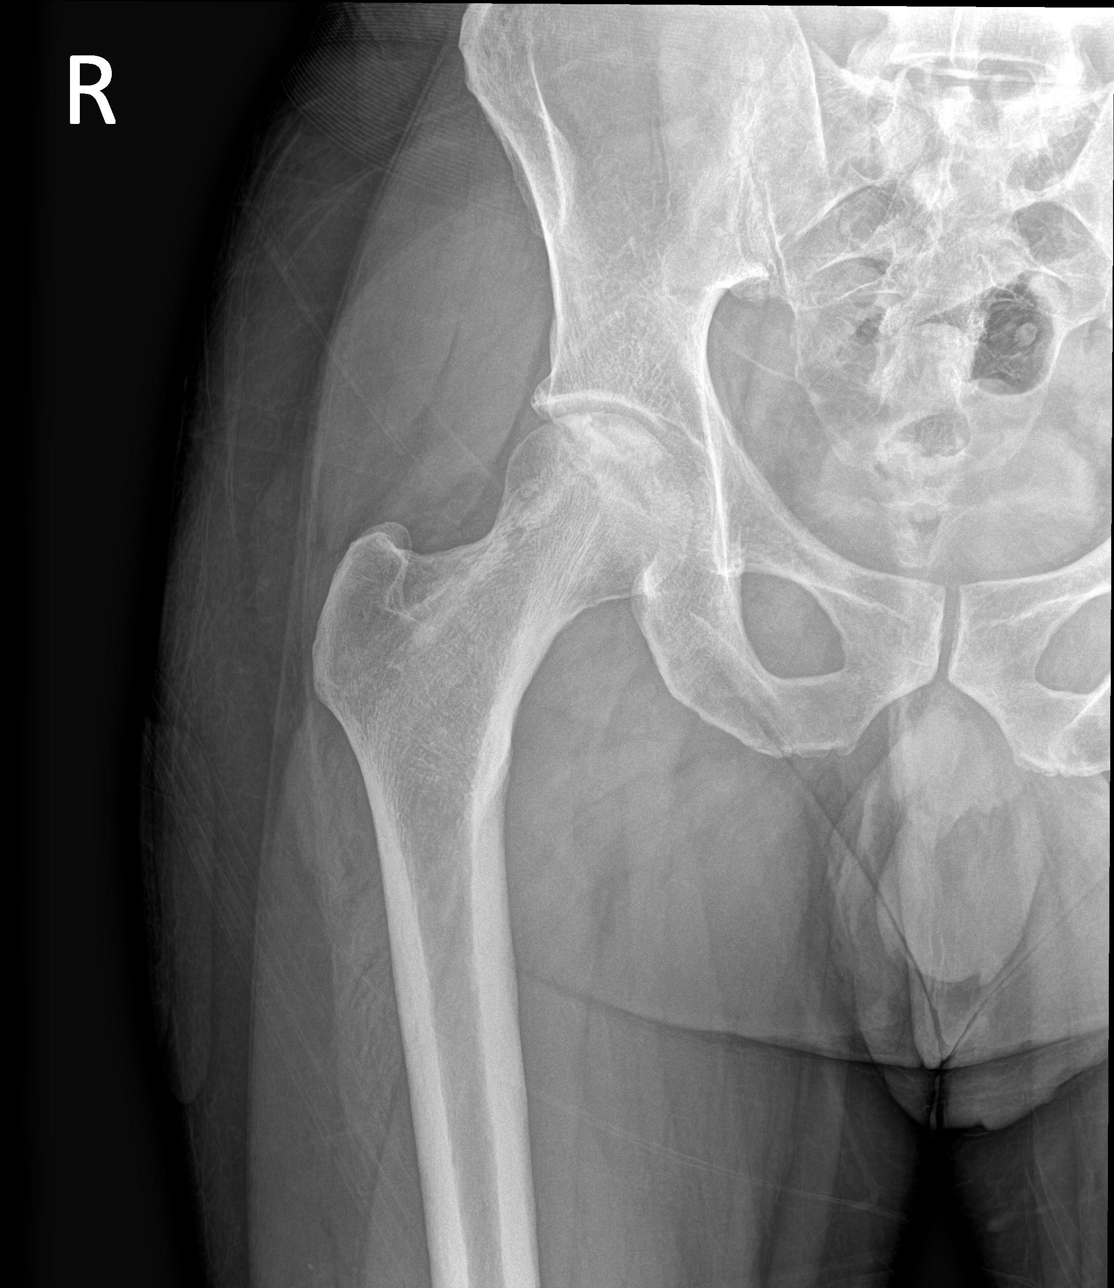

[hip lat]
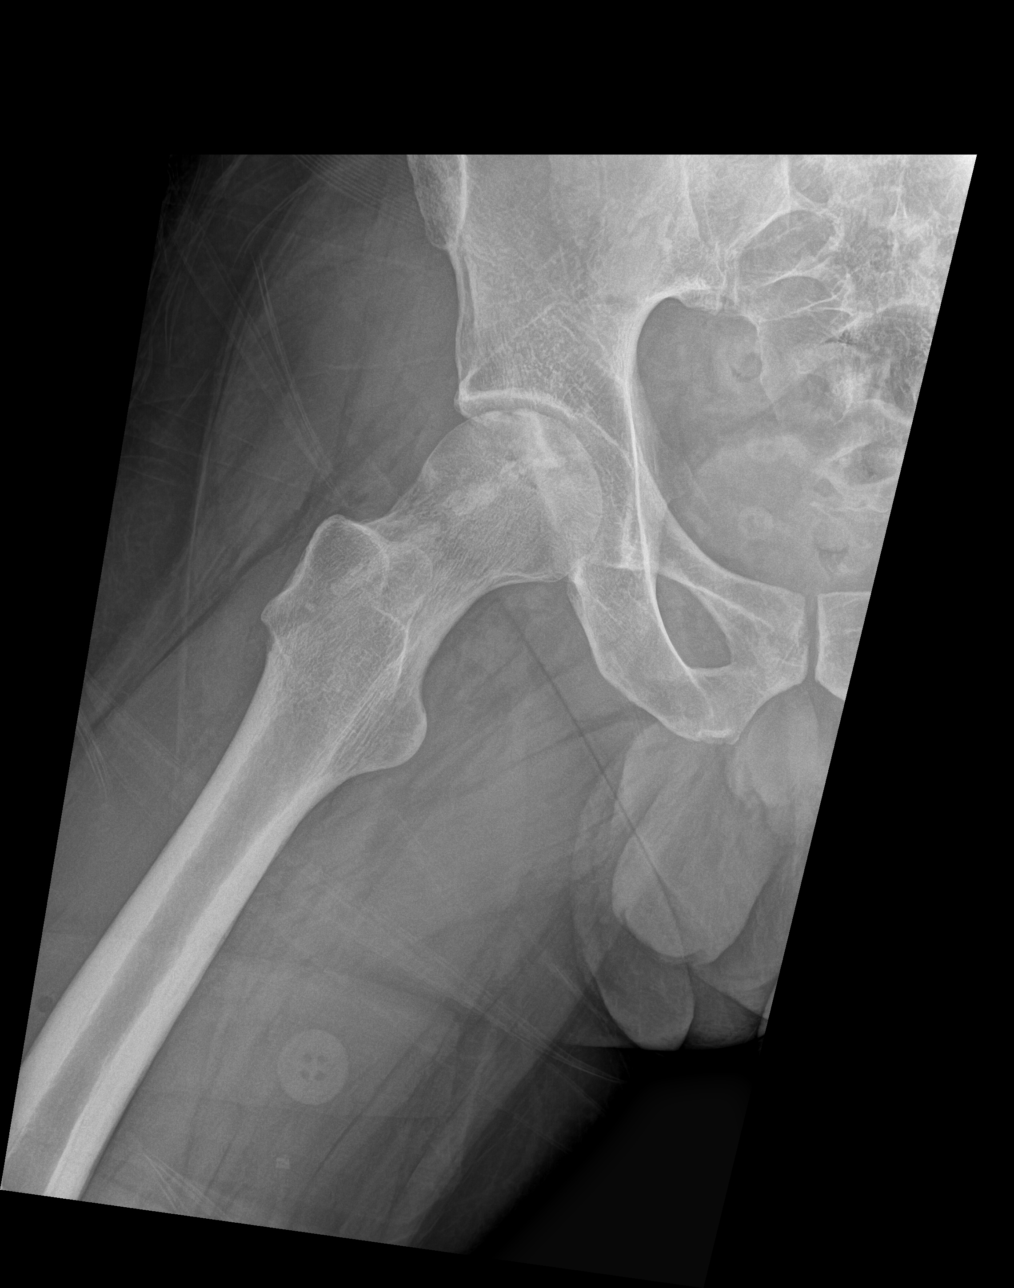

[3 of 3 positions shown; findings below may reference images not displayed]

FINDINGS: Linear sclerosis traversing the right trochanteric femur consistent
with core decompression. Serpiginous density in the right femoral
head consistent with avascular necrosis, but no radiographic
evidence of collapse. The acetabular fracture on MRI is not seen by
radiograph.
IMPRESSION: 1. Linear sclerosis traversing the right trochanteric femur
consistent with core decompression.
2. Avascular necrosis of the right femoral head.
3. The right acetabular fracture on MRI is not seen by radiograph.

## 2021-12-13 NOTE — Progress Notes (Signed)
? ?                            ? ? ?Post Operative Evaluation ?  ? ?Procedure/Date of Surgery: Right hip core decompression performed April 11,2023 ? ?Interval History:  ? ? ?Presents today status post the above procedure.  Overall he is doing extremely well.  He states that he has been compliant with nonweightbearing.  He did briefly attempt to put weight on it and felt essentially 0 pain.  At this time he is quite satisfied with his outcome.  He has been compliant with aspirin.  He has been participating in physical therapy.  He is not currently using 1 crutch under his right arm. ? ? ?PMH/PSH/Family History/Social History/Meds/Allergies:   ? ?Past Medical History:  ?Diagnosis Date  ? Allergy   ? Anxiety   ? GERD (gastroesophageal reflux disease)   ? History of hiatal hernia   ? Hyperlipidemia   ? ?Past Surgical History:  ?Procedure Laterality Date  ? COLONOSCOPY  01/2017  ? DECOMPRESSION HIP-CORE Right 11/27/2021  ? Procedure: RIGHT DECOMPRESSION HIP - CORE WITH BONE MARROW ASPIRATION;  Surgeon: Vanetta Mulders, MD;  Location: [facility];  Service: Orthopedics;  Laterality: Right;  ? ESOPHAGOGASTRODUODENOSCOPY  01/2017  ? hiatal hernia  ? NASAL SEPTUM SURGERY  2018  ? SEPTOPLASTY    ? & turbinate reduction  ? ?Social History  ? ?Socioeconomic History  ? Marital status: Married  ?  Spouse name: Not on file  ? Number of children: Not on file  ? Years of education: Not on file  ? Highest education level: Not on file  ?Occupational History  ? Not on file  ?Tobacco Use  ? Smoking status: Never  ? Smokeless tobacco: Never  ?Vaping Use  ? Vaping Use: Never used  ?Substance and Sexual Activity  ? Alcohol use: Yes  ?  Alcohol/week: 10.0 standard drinks  ?  Types: 5 Cans of beer, 5 Shots of liquor per week  ? Drug use: Never  ? Sexual activity: Not on file  ?Other Topics Concern  ? Not on file  ?Social History Narrative  ? Married, wife in PhD music program [employer], he teaches music and band at [employer], exercise  most days per week, Darrick Meigs, has son in school of arts.   01/2021  ? ?Social Determinants of Health  ? ?Financial Resource Strain: Not on file  ?Food Insecurity: Not on file  ?Transportation Needs: Not on file  ?Physical Activity: Not on file  ?Stress: Not on file  ?Social Connections: Not on file  ? ?Family History  ?Problem Relation Age of Onset  ? Healthy Mother   ? Diabetes Father   ? Healthy Brother   ? Colon polyps Brother   ? Healthy Brother   ? Arthritis Maternal Grandmother   ? Diabetes Paternal Grandmother   ? Lung cancer Paternal Grandmother   ? Diabetes Paternal Grandfather   ? Heart disease Paternal Grandfather   ? ?Allergies  ?Allergen Reactions  ? Grass Pollen([id number]-Swt Vern)   ? Pollen Extract   ? ?Current Outpatient Medications  ?Medication Sig Dispense Refill  ? ALPRAZolam (XANAX) 0.5 MG tablet Take 1 tablet (0.5 mg total) by mouth at bedtime as needed for anxiety. 30 tablet 0  ? aspirin EC 325 MG tablet Take 1 tablet (325 mg total) by mouth daily. 30 tablet 0  ? atorvastatin (LIPITOR) 10 MG tablet Take 1 tablet (10  mg total) by mouth daily. 90 tablet 3  ? escitalopram (LEXAPRO) 10 MG tablet TAKE 1 TABLET BY MOUTH EVERY DAY 90 tablet 0  ? Fexofenadine HCl (ALLEGRA PO) Take 1 tablet by mouth daily.    ? Olopatadine HCl (PATADAY) 0.2 % SOLN Place 1 drop in each eye once a day as needed for itchy watery eyes 2.5 mL 5  ? omeprazole (PRILOSEC) 20 MG capsule TAKE 1 CAPSULE BY MOUTH EVERY DAY IN THE MORNING 90 capsule 1  ? oxyCODONE (OXY IR/ROXICODONE) 5 MG immediate release tablet Take 1 tablet (5 mg total) by mouth every 4 (four) hours as needed (severe pain). 20 tablet 0  ? Vitamin D, Ergocalciferol, (DRISDOL) 1.25 MG (50000 UNIT) CAPS capsule Take 1 capsule (50,000 Units total) by mouth every 7 (seven) days. (Patient taking differently: Take 50,000 Units by mouth every 7 (seven) days. Wednesday) 12 capsule 3  ? ?No current facility-administered medications for this visit.  ? ?No results  found. ? ?Review of Systems:   ?A ROS was performed including pertinent positives and negatives as documented in the HPI. ? ? ?Musculoskeletal Exam:   ? ?There were no vitals taken for this visit. ? ?Right hip with no pain with 20 degrees of internal and external rotation.  He is able to flex of the right hip while sitting.  Walks with mildly antalgic gait with a crutch on the right hip.  He is able to extend at the right knee.  Previous iliac crest and lateral hip incisions are well-healing without erythema or drainage.  2+ dorsalis pedis pulse ? ?Imaging:   ? ?X-ray right hip 3 views: ?Status post core decompression without evidence of complication ? ?I personally reviewed and interpreted the radiographs. ? ? ?Assessment:   ?46 year old male 2 weeks status post right core decompression doing extremely well.  At this time I would like him to gradually progress his weightbearing to full over the course of the next 2 weeks.  He will continue to work with physical therapy for strengthening program about the right hip.  I will see him back in 4 weeks for reassessment ? ?Plan :   ? ?-Return to clinic in 4 weeks for reassessment ? ? ? ? ? ?I personally saw and evaluated the patient, and participated in the management and treatment plan. ? ?Vanetta Mulders, MD ?Attending Physician, Orthopedic Surgery ? ?This document was dictated using Systems analyst. A reasonable attempt at proof reading has been made to minimize errors. ?

## 2021-12-17 ENCOUNTER — Other Ambulatory Visit (HOSPITAL_BASED_OUTPATIENT_CLINIC_OR_DEPARTMENT_OTHER): Payer: Self-pay | Admitting: Orthopaedic Surgery

## 2021-12-17 DIAGNOSIS — M87051 Idiopathic aseptic necrosis of right femur: Secondary | ICD-10-CM

## 2021-12-17 NOTE — Progress Notes (Signed)
DISREGARD VIALS NOT MADE. ?

## 2021-12-18 ENCOUNTER — Other Ambulatory Visit: Payer: Self-pay | Admitting: Medical

## 2021-12-19 ENCOUNTER — Telehealth: Payer: Self-pay | Admitting: Medical

## 2021-12-19 ENCOUNTER — Encounter (HOSPITAL_BASED_OUTPATIENT_CLINIC_OR_DEPARTMENT_OTHER): Payer: Self-pay | Admitting: Physical Therapy

## 2021-12-19 ENCOUNTER — Other Ambulatory Visit: Payer: Self-pay | Admitting: Medical

## 2021-12-19 ENCOUNTER — Ambulatory Visit (HOSPITAL_BASED_OUTPATIENT_CLINIC_OR_DEPARTMENT_OTHER): Payer: BC Managed Care – PPO | Attending: Orthopaedic Surgery | Admitting: Physical Therapy

## 2021-12-19 DIAGNOSIS — M25552 Pain in left hip: Secondary | ICD-10-CM | POA: Diagnosis present

## 2021-12-19 DIAGNOSIS — R2689 Other abnormalities of gait and mobility: Secondary | ICD-10-CM | POA: Diagnosis present

## 2021-12-19 DIAGNOSIS — M25652 Stiffness of left hip, not elsewhere classified: Secondary | ICD-10-CM | POA: Insufficient documentation

## 2021-12-19 MED ORDER — ALPRAZOLAM 0.5 MG PO TABS
0.5000 mg | ORAL_TABLET | Freq: Every evening | ORAL | 0 refills | Status: AC | PRN
Start: 2021-12-19 — End: ?

## 2021-12-19 MED ORDER — ALPRAZOLAM 0.5 MG PO TABS: 0.5000 mg | ORAL_TABLET | Freq: Every evening | ORAL | 0 refills | Status: DC | PRN

## 2021-12-19 NOTE — Therapy (Signed)
?OUTPATIENT PHYSICAL THERAPY LOWER EXTREMITY EVALUATION ? ? ?Patient Name: Cameron Joyce ?MRN: [id number] ?DOB:06-Oct-1975, 46 y.o., male ?Today's Date: 12/19/2021 ? ? PT End of Session - 12/19/21 1558   ? ? Visit Number 2   ? Number of Visits 16   ? Date for PT Re-Evaluation 02/07/22   ? PT Start Time [phone]   ? PT Stop Time 0350   ? PT Time Calculation (min) 38 min   ? Activity Tolerance Patient tolerated treatment well   ? Behavior During Therapy [facility] for tasks assessed/performed   ? ?  ?  ? ?  ? ? ?Past Medical History:  ?Diagnosis Date  ? Allergy   ? Anxiety   ? GERD (gastroesophageal reflux disease)   ? History of hiatal hernia   ? Hyperlipidemia   ? ?Past Surgical History:  ?Procedure Laterality Date  ? COLONOSCOPY  01/2017  ? DECOMPRESSION HIP-CORE Right 11/27/2021  ? Procedure: RIGHT DECOMPRESSION HIP - CORE WITH BONE MARROW ASPIRATION;  Surgeon: [name], MD;  Location: [facility];  Service: Orthopedics;  Laterality: Right;  ? ESOPHAGOGASTRODUODENOSCOPY  01/2017  ? hiatal hernia  ? NASAL SEPTUM SURGERY  2018  ? SEPTOPLASTY    ? & turbinate reduction  ? ?Patient Active Problem List  ? Diagnosis Date Noted  ? Avascular necrosis of right femur ([facility])   ? Pain in right hip 11/06/2021  ? Need for Tdap vaccination 02/08/2021  ? History of hip fracture 02/08/2021  ? Chronic right hip pain 07/03/2020  ? Encounter for health maintenance examination in adult 11/05/2019  ? Gastroesophageal reflux disease 11/05/2019  ? Hiatal hernia 11/05/2019  ? Allergic rhinitis due to pollen 11/05/2019  ? Hyperlipidemia 11/05/2019  ? Work stress 11/05/2019  ? Vaccine counseling 11/05/2019  ? Seasonal and perennial allergic rhinitis 11/01/2019  ? Allergic conjunctivitis 11/01/2019  ? ? ?PCP: Jac Canavan, PA-C ? ?REFERRING PROVIDER: [name], MD ? ?REFERRING DIAG: Left Hip Avascular Necrosis and core decompression.  ? ?THERAPY DIAG:  ?Pain in left hip ? ?Stiffness of left hip, not elsewhere classified ? ?Other abnormalities of  gait and mobility ? ?ONSET DATE: 11/27/2021 decompression  ? ?SUBJECTIVE:  ? ?SUBJECTIVE STATEMENT: ?The patient had a right hip core decompression on 4/11/20223. At this time he is WBAT on crutches. He has been working on progressive weight being at home.  ? ?PERTINENT HISTORY: ? ? ?PAIN:  ?Are you having pain? Yes: NPRS scale: 1/10 just feels it from time to time  ?Pain location: anterior hip  ?Pain description: ache  ?Aggravating factors: when he tries to cross his legs  ?Relieving factors: not doing that  ? ?PRECAUTIONS: None ? ?WEIGHT BEARING RESTRICTIONS Yes WBAT ? ?FALLS:  ?Has patient fallen in last 6 months? No ? ?LIVING ENVIRONMENT: ?Lives with: 2 steps in the front door  ? ?OCCUPATION: band director  ? ? ?PLOF: Independent ? ?PATIENT GOALS  ?To have less pain  ? ? ?OBJECTIVE:  ? ?DIAGNOSTIC FINDINGS:  ? ?PATIENT SURVEYS:  ?FOTO   ? ?COGNITION: ? Overall cognitive status: Within functional limits for tasks assessed   ?  ?SENSATION: ?WFL ? ? ? ?POSTURE:  ?good ? ?PALPATION: ?No unexpted TTP  ? ?LE ROM: ? ?Active ROM Right ?12/19/2021 Left ?12/19/2021  ?Hip flexion 90 with mild pain  Full   ?Hip extension    ?Hip abduction    ?Hip adduction    ?Hip internal rotation Mild pain but full range    ?Hip external rotation 30 degrees  not pushed to end range    ?Knee flexion    ?Knee extension    ?Ankle dorsiflexion    ?Ankle plantarflexion    ?Ankle inversion    ?Ankle eversion    ? (Blank rows = not tested) ? ?LE MMT: ? ?MMT Right ?12/19/2021 Left ?12/19/2021  ?Hip flexion 11.9 46.8  ?Hip extension    ?Hip abduction 36.0 42.1  ?Hip adduction    ?Hip internal rotation    ?Hip external rotation    ?Knee flexion    ?Knee extension 46.1 42.1  ?Ankle dorsiflexion    ?Ankle plantarflexion    ?Ankle inversion    ?Ankle eversion    ? (Blank rows = not tested) ( advised to give as much resistance as he could pain free ? ? ?GAIT: ?Walked without crutches with no major gait deviations ? ? ?TODAY'S TREATMENT: ?5/3 ? ?Reviewed self  stretch into ER to put his socks on  ? ? ?Hip abduction red 2x15  ?Bridge 2x10  ?Supine march 2x10  ? ?PROM into flexion IR and ER  ? ?Heel raises 2x10  ? ?Step up 2 inch  ?Forward x15  ?Lateral x15  ?Step down x15  ? ?Nu-step 5 min  ? ? ?Eval  ?Exercises ?- Supine Lower Trunk Rotation  - 2 x daily - 7 x weekly - 3 sets - 10 reps ?- Heel Raises with Counter Support  - 2 x daily - 7 x weekly - 3 sets - 10 reps ?- Hip and Knee Extension and Flexion Caregiver PROM  - 2 x daily - 7 x weekly - 3 sets - 5 reps - 10 sec  hold ?- Standing March with Counter Support  - 2 x daily - 7 x weekly - 2 sets - 10 reps ? ? ?PATIENT EDUCATION:  ?Education details: reviewed weaning program for the crutches; reviewed HEP and symptom management  ?Person educated: Patient ?Education method: Explanation, Demonstration, Tactile cues, Verbal cues, and Handouts ?Education comprehension: verbalized understanding, returned demonstration, verbal cues required, tactile cues required, and needs further education ? ? ?HOME EXERCISE PROGRAM: ?Access Code: [email or web address] ?URL: [email or web address] ?Date: 12/13/2021 ?Prepared by: Lorayne Benderavid Telesa Jeancharles ? ?Exercises ?- Supine Lower Trunk Rotation  - 2 x daily - 7 x weekly - 3 sets - 10 reps ?- Heel Raises with Counter Support  - 2 x daily - 7 x weekly - 3 sets - 10 reps ?- Hip and Knee Extension and Flexion Caregiver PROM  - 2 x daily - 7 x weekly - 3 sets - 5 reps - 10 sec  hold ?- Standing March with Counter Support  - 2 x daily - 7 x weekly - 2 sets - 10 reps ? ?ASSESSMENT: ? ?CLINICAL IMPRESSION: ?The patient is making excellent progress. We were able to progress his table exercises and start functional standing exercises. He had no significant increase in pain he has had some trouble putting his sock on the surgical foot. We reviewed a stretch for home. He began stair training today without pain. His hip flexion has improved significantly. We will continue to progress as tolerated.  ? ? ?OBJECTIVE  IMPAIRMENTS Abnormal gait, decreased activity tolerance, decreased endurance, difficulty walking, decreased ROM, decreased strength, impaired flexibility, and pain.  ? ?ACTIVITY LIMITATIONS cleaning, community activity, meal prep, occupation, and shopping.  ? ?PERSONAL FACTORS None  ? ? ?REHAB POTENTIAL: Excellent ? ?CLINICAL DECISION MAKING: Stable/uncomplicated ? ?EVALUATION COMPLEXITY: Low ? ? ?GOALS: ?Goals reviewed with patient? Yes ? ?SHORT TERM GOALS:  Target date: 01/16/2022 ? ?Patient will demonstrate full active hip flexion  ?Baseline: ?Goal status: INITIAL ? ?2.  Patient will ambulate 1/2 mile without the crutches with no pain  ?Baseline:  ?Goal status: INITIAL ? ?3.  Patient will increase gross right LE strength by 5 lbs ?Baseline:  ?Goal status: INITIAL ? ? ?LONG TERM GOALS: Target date: 02/13/2022 ? ?Patient will ambulate 2 miles for exercises without pain in order to exercises  ?Baseline:  ?Goal status: INITIAL ? ?2.  Patient will stand for 1 hour without pain in order to perform ADL's  ?Baseline:  ?Goal status: INITIAL ? ?3.  Patient will sit at work for 2 hours without pain  ?Baseline:  ?Goal status: INITIAL ? ? ? ? ?PLAN: ?PT FREQUENCY: 1-2x/week ? ?PT DURATION: 8 weeks ? ?PLANNED INTERVENTIONS: Therapeutic exercises, Therapeutic activity, Neuromuscular re-education, Balance training, Gait training, Patient/Family education, Joint mobilization, DME instructions, Aquatic Therapy, Dry Needling, Electrical stimulation, Taping, Ultrasound, Ionotophoresis 4mg /ml Dexamethasone, and Manual therapy ? ?PLAN FOR NEXT SESSION: continue with light ROM; continue with progressive weight bearing. Progress to the pool if needed.  ? ? ? , PT ?12/19/2021, 3:59 PM  ?

## 2021-12-19 NOTE — Telephone Encounter (Signed)
Reminder to send in xanax script ?

## 2021-12-28 ENCOUNTER — Encounter (HOSPITAL_BASED_OUTPATIENT_CLINIC_OR_DEPARTMENT_OTHER): Payer: Self-pay | Admitting: Physical Therapy

## 2021-12-28 ENCOUNTER — Ambulatory Visit (HOSPITAL_BASED_OUTPATIENT_CLINIC_OR_DEPARTMENT_OTHER): Payer: BC Managed Care – PPO | Admitting: Physical Therapy

## 2021-12-28 DIAGNOSIS — R2689 Other abnormalities of gait and mobility: Secondary | ICD-10-CM

## 2021-12-28 DIAGNOSIS — M25552 Pain in left hip: Secondary | ICD-10-CM

## 2021-12-28 DIAGNOSIS — M25652 Stiffness of left hip, not elsewhere classified: Secondary | ICD-10-CM

## 2021-12-28 NOTE — Therapy (Signed)
?OUTPATIENT PHYSICAL THERAPY LOWER EXTREMITY EVALUATION ? ? ?Patient Name: Cameron Joyce ?MRN: [id number] ?DOB:October 25, 1975, 46 y.o., male ?Today's Date: 12/30/2021 ? ? PT End of Session - 12/30/21 1607   ? ? Number of Visits 16   ? Date for PT Re-Evaluation 02/07/22   ? PT Start Time 1600   ? PT Stop Time 1640   ? PT Time Calculation (min) 40 min   ? Activity Tolerance Patient tolerated treatment well   ? Behavior During Therapy [facility] for tasks assessed/performed   ? ?  ?  ? ?  ? ? ? ?Past Medical History:  ?Diagnosis Date  ? Allergy   ? Anxiety   ? GERD (gastroesophageal reflux disease)   ? History of hiatal hernia   ? Hyperlipidemia   ? ?Past Surgical History:  ?Procedure Laterality Date  ? COLONOSCOPY  01/2017  ? DECOMPRESSION HIP-CORE Right 11/27/2021  ? Procedure: RIGHT DECOMPRESSION HIP - CORE WITH BONE MARROW ASPIRATION;  Surgeon: Vanetta Mulders, MD;  Location: [place];  Service: Orthopedics;  Laterality: Right;  ? ESOPHAGOGASTRODUODENOSCOPY  01/2017  ? hiatal hernia  ? NASAL SEPTUM SURGERY  2018  ? SEPTOPLASTY    ? & turbinate reduction  ? ?Patient Active Problem List  ? Diagnosis Date Noted  ? Avascular necrosis of right femur ([place])   ? Pain in right hip 11/06/2021  ? Need for Tdap vaccination 02/08/2021  ? History of hip fracture 02/08/2021  ? Chronic right hip pain 07/03/2020  ? Encounter for health maintenance examination in adult 11/05/2019  ? Gastroesophageal reflux disease 11/05/2019  ? Hiatal hernia 11/05/2019  ? Allergic rhinitis due to pollen 11/05/2019  ? Hyperlipidemia 11/05/2019  ? Work stress 11/05/2019  ? Vaccine counseling 11/05/2019  ? Seasonal and perennial allergic rhinitis 11/01/2019  ? Allergic conjunctivitis 11/01/2019  ? ? ?PCP: Carlena Hurl, PA-C ? ?REFERRING PROVIDER: Carlena Hurl, PA-C ? ?REFERRING DIAG: Left Hip Avascular Necrosis and core decompression.  ? ?THERAPY DIAG:  ?Pain in left hip ? ?Stiffness of left hip, not elsewhere classified ? ?Other abnormalities of gait and  mobility ? ?ONSET DATE: 11/27/2021 decompression  ? ?SUBJECTIVE:  ? ?SUBJECTIVE STATEMENT: ?The patient report he has had an increase in pain since yesterday brcause he had to set up a concert. He has otherwise been doing well.  ?PERTINENT HISTORY: ? ? ?PAIN:  ?Are you having pain? Yes: NPRS scale: 1/10 just feels it from time to time  ?Pain location: anterior hip  ?Pain description: ache  ?Aggravating factors: when he tries to cross his legs  ?Relieving factors: not doing that  ? ?PRECAUTIONS: None ? ?WEIGHT BEARING RESTRICTIONS Yes WBAT ? ?FALLS:  ?Has patient fallen in last 6 months? No ? ?LIVING ENVIRONMENT: ?Lives with: 2 steps in the front door  ? ?OCCUPATION: band director  ? ? ?PLOF: Independent ? ?PATIENT GOALS  ?To have less pain  ? ? ?OBJECTIVE:  ? ?DIAGNOSTIC FINDINGS:  ? ?PATIENT SURVEYS:  ?FOTO   ? ?COGNITION: ? Overall cognitive status: Within functional limits for tasks assessed   ?  ?SENSATION: ?WFL ? ? ? ?POSTURE:  ?good ? ?PALPATION: ?No unexpted TTP  ? ?LE ROM: ? ?Active ROM Right ?12/30/2021 Left ?12/30/2021  ?Hip flexion 90 with mild pain  Full   ?Hip extension    ?Hip abduction    ?Hip adduction    ?Hip internal rotation Mild pain but full range    ?Hip external rotation 30 degrees not pushed to end range    ?  Knee flexion    ?Knee extension    ?Ankle dorsiflexion    ?Ankle plantarflexion    ?Ankle inversion    ?Ankle eversion    ? (Blank rows = not tested) ? ?LE MMT: ? ?MMT Right ?12/30/2021 Left ?12/30/2021  ?Hip flexion 11.9 46.8  ?Hip extension    ?Hip abduction 36.0 42.1  ?Hip adduction    ?Hip internal rotation    ?Hip external rotation    ?Knee flexion    ?Knee extension 46.1 42.1  ?Ankle dorsiflexion    ?Ankle plantarflexion    ?Ankle inversion    ?Ankle eversion    ? (Blank rows = not tested) ( advised to give as much resistance as he could pain free ? ? ?GAIT: ?Walked without crutches with no major gait deviations ? ? ?TODAY'S TREATMENT: ?5/12 ?Bridge 2x15 ?SLR ( advised to montior  anterior hip pain)  ?Supine march 2x10  ? ?Manual: PROM into all planes  ? ?Standing: lateral band walk 2x10 yellow.  ? ?Standing 3 way hip yellow 2x10 each leg  ? ? ? ? ?5/3 ? ?Reviewed self stretch into ER to put his socks on  ? ? ?Hip abduction red 2x15  ?Bridge 2x10  ?Supine march 2x10  ? ? ? ?PROM into flexion IR and ER  ? ?Heel raises 2x10  ? ?Step up 2 inch  ?Forward x15  ?Lateral x15  ?Step down x15  ? ?Nu-step 5 min  ? ?Hip 3way  ?3x10  ? ? ?Eval  ?Exercises ?- Supine Lower Trunk Rotation  - 2 x daily - 7 x weekly - 3 sets - 10 reps ?- Heel Raises with Counter Support  - 2 x daily - 7 x weekly - 3 sets - 10 reps ?- Hip and Knee Extension and Flexion Caregiver PROM  - 2 x daily - 7 x weekly - 3 sets - 5 reps - 10 sec  hold ?- Standing March with Counter Support  - 2 x daily - 7 x weekly - 2 sets - 10 reps ? ? ?PATIENT EDUCATION:  ?Education details: reviewed weaning program for the crutches; reviewed HEP and symptom management  ?Person educated: Patient ?Education method: Explanation, Demonstration, Tactile cues, Verbal cues, and Handouts ?Education comprehension: verbalized understanding, returned demonstration, verbal cues required, tactile cues required, and needs further education ? ? ?HOME EXERCISE PROGRAM: ?Access Code: [email or web address] ?URL: [email or web address] ?Date: 12/13/2021 ?Prepared by: Carolyne Littles ? ?Exercises ?- Supine Lower Trunk Rotation  - 2 x daily - 7 x weekly - 3 sets - 10 reps ?- Heel Raises with Counter Support  - 2 x daily - 7 x weekly - 3 sets - 10 reps ?- Hip and Knee Extension and Flexion Caregiver PROM  - 2 x daily - 7 x weekly - 3 sets - 5 reps - 10 sec  hold ?- Standing March with Counter Support  - 2 x daily - 7 x weekly - 2 sets - 10 reps ? ?ASSESSMENT: ? ?CLINICAL IMPRESSION: ?Therapy updated HEP. He tolerated there-ex well. He was advised to continue his exercise and monitor pain tolerate. He had a little more pain with activity yesterday. He continues to use his  cane but he is using it less.  ? ?OBJECTIVE IMPAIRMENTS Abnormal gait, decreased activity tolerance, decreased endurance, difficulty walking, decreased ROM, decreased strength, impaired flexibility, and pain.  ? ?ACTIVITY LIMITATIONS cleaning, community activity, meal prep, occupation, and shopping.  ? ?PERSONAL FACTORS None  ? ? ?REHAB POTENTIAL: Excellent ? ?CLINICAL  DECISION MAKING: Stable/uncomplicated ? ?EVALUATION COMPLEXITY: Low ? ? ?GOALS: ?Goals reviewed with patient? Yes ? ?SHORT TERM GOALS: Target date: 01/27/2022 ? ?Patient will demonstrate full active hip flexion  ?Baseline: ?Goal status: INITIAL ? ?2.  Patient will ambulate 1/2 mile without the crutches with no pain  ?Baseline:  ?Goal status: INITIAL ? ?3.  Patient will increase gross right LE strength by 5 lbs ?Baseline:  ?Goal status: INITIAL ? ? ?LONG TERM GOALS: Target date: 02/24/2022 ? ?Patient will ambulate 2 miles for exercises without pain in order to exercises  ?Baseline:  ?Goal status: INITIAL ? ?2.  Patient will stand for 1 hour without pain in order to perform ADL's  ?Baseline:  ?Goal status: INITIAL ? ?3.  Patient will sit at work for 2 hours without pain  ?Baseline:  ?Goal status: INITIAL ? ? ? ? ?PLAN: ?PT FREQUENCY: 1-2x/week ? ?PT DURATION: 8 weeks ? ?PLANNED INTERVENTIONS: Therapeutic exercises, Therapeutic activity, Neuromuscular re-education, Balance training, Gait training, Patient/Family education, Joint mobilization, DME instructions, Aquatic Therapy, Dry Needling, Electrical stimulation, Taping, Ultrasound, Ionotophoresis 4mg /ml Dexamethasone, and Manual therapy ? ?PLAN FOR NEXT SESSION: continue with light ROM; continue with progressive weight bearing. Progress to the pool if needed.  ? ? ?Carney Living, PT ?12/30/2021, 4:14 PM  ?

## 2021-12-30 ENCOUNTER — Encounter (HOSPITAL_BASED_OUTPATIENT_CLINIC_OR_DEPARTMENT_OTHER): Payer: Self-pay | Admitting: Physical Therapy

## 2022-01-01 ENCOUNTER — Ambulatory Visit (INDEPENDENT_AMBULATORY_CARE_PROVIDER_SITE_OTHER): Payer: BC Managed Care – PPO

## 2022-01-01 DIAGNOSIS — J309 Allergic rhinitis, unspecified: Secondary | ICD-10-CM

## 2022-01-02 ENCOUNTER — Ambulatory Visit (HOSPITAL_BASED_OUTPATIENT_CLINIC_OR_DEPARTMENT_OTHER): Payer: BC Managed Care – PPO | Admitting: Physical Therapy

## 2022-01-02 DIAGNOSIS — R2689 Other abnormalities of gait and mobility: Secondary | ICD-10-CM

## 2022-01-02 DIAGNOSIS — M25552 Pain in left hip: Secondary | ICD-10-CM

## 2022-01-02 DIAGNOSIS — M25652 Stiffness of left hip, not elsewhere classified: Secondary | ICD-10-CM

## 2022-01-03 ENCOUNTER — Encounter (HOSPITAL_BASED_OUTPATIENT_CLINIC_OR_DEPARTMENT_OTHER): Payer: Self-pay | Admitting: Physical Therapy

## 2022-01-03 NOTE — Therapy (Signed)
OUTPATIENT PHYSICAL THERAPY LOWER EXTREMITY EVALUATION   Patient Name: Cameron Joyce MRN: [id number] DOB:24-Nov-1975, 46 y.o., male Today's Date: 01/03/2022   PT End of Session - 01/03/22 0917     Visit Number 4    Number of Visits 16    Date for PT Re-Evaluation 02/07/22    PT Start Time 1600    PT Stop Time 1641    PT Time Calculation (min) 41 min    Activity Tolerance Patient tolerated treatment well    Behavior During Therapy [facility] for tasks assessed/performed              Past Medical History:  Diagnosis Date   Allergy    Anxiety    GERD (gastroesophageal reflux disease)    History of hiatal hernia    Hyperlipidemia    Past Surgical History:  Procedure Laterality Date   COLONOSCOPY  01/2017   DECOMPRESSION HIP-CORE Right 11/27/2021   Procedure: RIGHT DECOMPRESSION HIP - [place];  Surgeon: Vanetta Mulders, MD;  Location: [place];  Service: Orthopedics;  Laterality: Right;   ESOPHAGOGASTRODUODENOSCOPY  01/2017   hiatal hernia   NASAL SEPTUM SURGERY  2018   SEPTOPLASTY     & turbinate reduction   Patient Active Problem List   Diagnosis Date Noted   Avascular necrosis of right femur ([place])    Pain in right hip 11/06/2021   Need for Tdap vaccination 02/08/2021   History of hip fracture 02/08/2021   Chronic right hip pain 07/03/2020   Encounter for health maintenance examination in adult 11/05/2019   Gastroesophageal reflux disease 11/05/2019   Hiatal hernia 11/05/2019   Allergic rhinitis due to pollen 11/05/2019   Hyperlipidemia 11/05/2019   Work stress 11/05/2019   Vaccine counseling 11/05/2019   Seasonal and perennial allergic rhinitis 11/01/2019   Allergic conjunctivitis 11/01/2019    PCP: Carlena Hurl, PA-C  REFERRING PROVIDER: Carlena Hurl, PA-C  REFERRING DIAG: Left Hip Avascular Necrosis and core decompression.   THERAPY DIAG:  Pain in left hip  Stiffness of left hip, not elsewhere classified  Other  abnormalities of gait and mobility  ONSET DATE: 11/27/2021 decompression   SUBJECTIVE:   SUBJECTIVE STATEMENT: The ptaient did some moving the other day and was sore for a day or two but it went away.    PERTINENT HISTORY: AVN of the right hip   PAIN:  Are you having pain? Yes: NPRS scale: 1/10 just feels it from time to time 5/18  Pain location: anterior hip  Pain description: ache  Aggravating factors: when he tries to cross his legs  Relieving factors: not doing that   PRECAUTIONS: None  WEIGHT BEARING RESTRICTIONS Yes WBAT  FALLS:  Has patient fallen in last 6 months? No  LIVING ENVIRONMENT: Lives with: 2 steps in the front door   OCCUPATION: band director    PLOF: Independent  PATIENT GOALS  To have less pain    OBJECTIVE:   DIAGNOSTIC FINDINGS:   PATIENT SURVEYS:  FOTO    COGNITION:  Overall cognitive status: Within functional limits for tasks assessed     SENSATION: WFL    POSTURE:  good  PALPATION: No unexpted TTP   LE ROM:  Active ROM Right 01/03/2022 Left 01/03/2022  Hip flexion 90 with mild pain  Full   Hip extension    Hip abduction    Hip adduction    Hip internal rotation Mild pain but full range    Hip external rotation 30  degrees not pushed to end range    Knee flexion    Knee extension    Ankle dorsiflexion    Ankle plantarflexion    Ankle inversion    Ankle eversion     (Blank rows = not tested)  LE MMT:  MMT Right 01/03/2022 Left 01/03/2022  Hip flexion 11.9 46.8  Hip extension    Hip abduction 36.0 42.1  Hip adduction    Hip internal rotation    Hip external rotation    Knee flexion    Knee extension 46.1 42.1  Ankle dorsiflexion    Ankle plantarflexion    Ankle inversion    Ankle eversion     (Blank rows = not tested) ( advised to give as much resistance as he could pain free   GAIT: Walked without crutches with no major gait deviations   TODAY'S TREATMENT: 5/18 Ex bike 5 min: no pain   Bridge  2x15 SLR ( advised to montior anterior hip pain)  Supine march 2x15 SL SLR 2x15    Manual: PROM into all planes   Leg press 50 lbs 3x10 no pain noted. Reviewed how to set it up for minimal pressure on the hip   Squat cable row 2x15 15 lbs  Squat cable ext 2x15 15 lbs   5/12 Bridge 2x15 SLR ( advised to montior anterior hip pain)  Supine march 2x10   Manual: PROM into all planes   Standing: lateral band walk 2x10 yellow.   Standing 3 way hip yellow 2x10 each leg      5/3  Reviewed self stretch into ER to put his socks on    Hip abduction red 2x15  Bridge 2x10  Supine march 2x10     PROM into flexion IR and ER   Heel raises 2x10   Step up 2 inch  Forward x15  Lateral x15  Step down x15   Nu-step 5 min   Hip 3way  3x10    Eval  Exercises - Supine Lower Trunk Rotation  - 2 x daily - 7 x weekly - 3 sets - 10 reps - Heel Raises with Counter Support  - 2 x daily - 7 x weekly - 3 sets - 10 reps - Hip and Knee Extension and Flexion Caregiver PROM  - 2 x daily - 7 x weekly - 3 sets - 5 reps - 10 sec  hold - Standing March with Counter Support  - 2 x daily - 7 x weekly - 2 sets - 10 reps   PATIENT EDUCATION:  Education details: reviewed weaning program for the crutches; reviewed HEP and symptom management  Person educated: Patient Education method: Explanation, Demonstration, Tactile cues, Verbal cues, and Handouts Education comprehension: verbalized understanding, returned demonstration, verbal cues required, tactile cues required, and needs further education   HOME EXERCISE PROGRAM: Access Code: [email or web address] URL: [email or web address] Date: 12/13/2021 Prepared by: Carolyne Littles  Exercises - Supine Lower Trunk Rotation  - 2 x daily - 7 x weekly - 3 sets - 10 reps - Heel Raises with Counter Support  - 2 x daily - 7 x weekly - 3 sets - 10 reps - Hip and Knee Extension and Flexion Caregiver PROM  - 2 x daily - 7 x weekly - 3 sets - 5 reps - 10  sec  hold - Standing March with Counter Support  - 2 x daily - 7 x weekly - 2 sets - 10 reps  ASSESSMENT:  CLINICAL IMPRESSION: Patient performed light  gym equipment exercise. When he moves he should have assess to the gym. We added in a side lying SLR to his HEP. He has band work as well. He continues to have some tightness with hip ER. He was shown a progressive stretch for that movement. He will come back in for 1 follow up prior to moving to [place].  OBJECTIVE IMPAIRMENTS Abnormal gait, decreased activity tolerance, decreased endurance, difficulty walking, decreased ROM, decreased strength, impaired flexibility, and pain.   ACTIVITY LIMITATIONS cleaning, community activity, meal prep, occupation, and shopping.   PERSONAL FACTORS None    REHAB POTENTIAL: Excellent  CLINICAL DECISION MAKING: Stable/uncomplicated  EVALUATION COMPLEXITY: Low   GOALS: Goals reviewed with patient? Yes  SHORT TERM GOALS: Target date: 01/31/2022  Patient will demonstrate full active hip flexion  Baseline: Goal status: INITIAL  2.  Patient will ambulate 1/2 mile without the crutches with no pain  Baseline:  Goal status: INITIAL  3.  Patient will increase gross right LE strength by 5 lbs Baseline:  Goal status: INITIAL   LONG TERM GOALS: Target date: 02/28/2022  Patient will ambulate 2 miles for exercises without pain in order to exercises  Baseline:  Goal status: INITIAL  2.  Patient will stand for 1 hour without pain in order to perform ADL's  Baseline:  Goal status: INITIAL  3.  Patient will sit at work for 2 hours without pain  Baseline:  Goal status: INITIAL     PLAN: PT FREQUENCY: 1-2x/week  PT DURATION: 8 weeks  PLANNED INTERVENTIONS: Therapeutic exercises, Therapeutic activity, Neuromuscular re-education, Balance training, Gait training, Patient/Family education, Joint mobilization, DME instructions, Aquatic Therapy, Dry Needling, Electrical stimulation, Taping,  Ultrasound, Ionotophoresis 4mg /ml Dexamethasone, and Manual therapy  PLAN FOR NEXT SESSION: continue with light ROM; continue with progressive weight bearing. Progress to the pool if needed.    Carney Living, PT 01/03/2022, 9:19 AM

## 2022-01-10 ENCOUNTER — Ambulatory Visit (INDEPENDENT_AMBULATORY_CARE_PROVIDER_SITE_OTHER): Payer: BC Managed Care – PPO | Admitting: Orthopaedic Surgery

## 2022-01-10 ENCOUNTER — Ambulatory Visit (INDEPENDENT_AMBULATORY_CARE_PROVIDER_SITE_OTHER): Payer: BC Managed Care – PPO

## 2022-01-10 DIAGNOSIS — M87051 Idiopathic aseptic necrosis of right femur: Secondary | ICD-10-CM | POA: Diagnosis not present

## 2022-01-10 IMAGING — DX DG HIP (WITH OR WITHOUT PELVIS) 4+V*R*
4 series · 4 of 4 positions shown · non-contrast
Comparison: Right hip radiographs [DATE]

CLINICAL DATA: Postop right hip core decompression for avascular
necrosis.

EXAM:
DG HIP (WITH OR WITHOUT PELVIS) 4+V RIGHT

[pelvis ap]
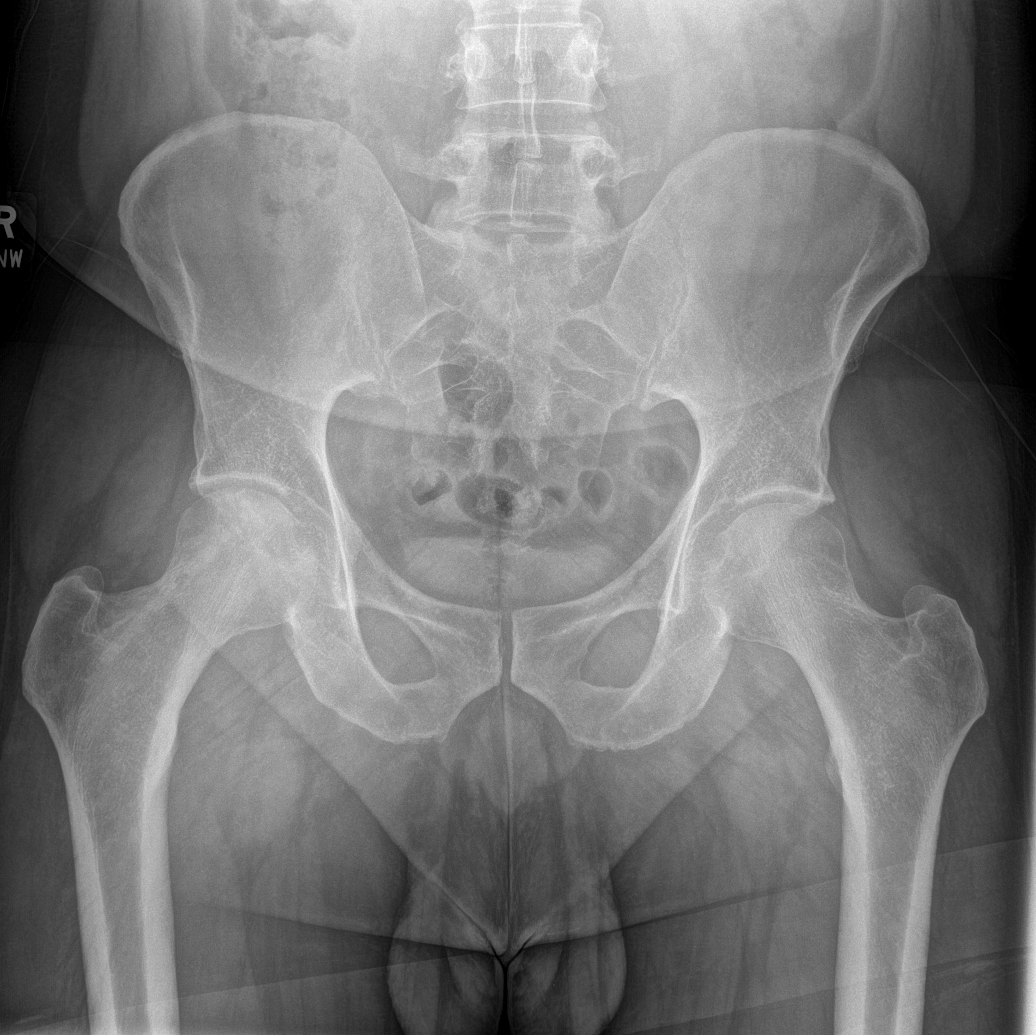

[hip ap]
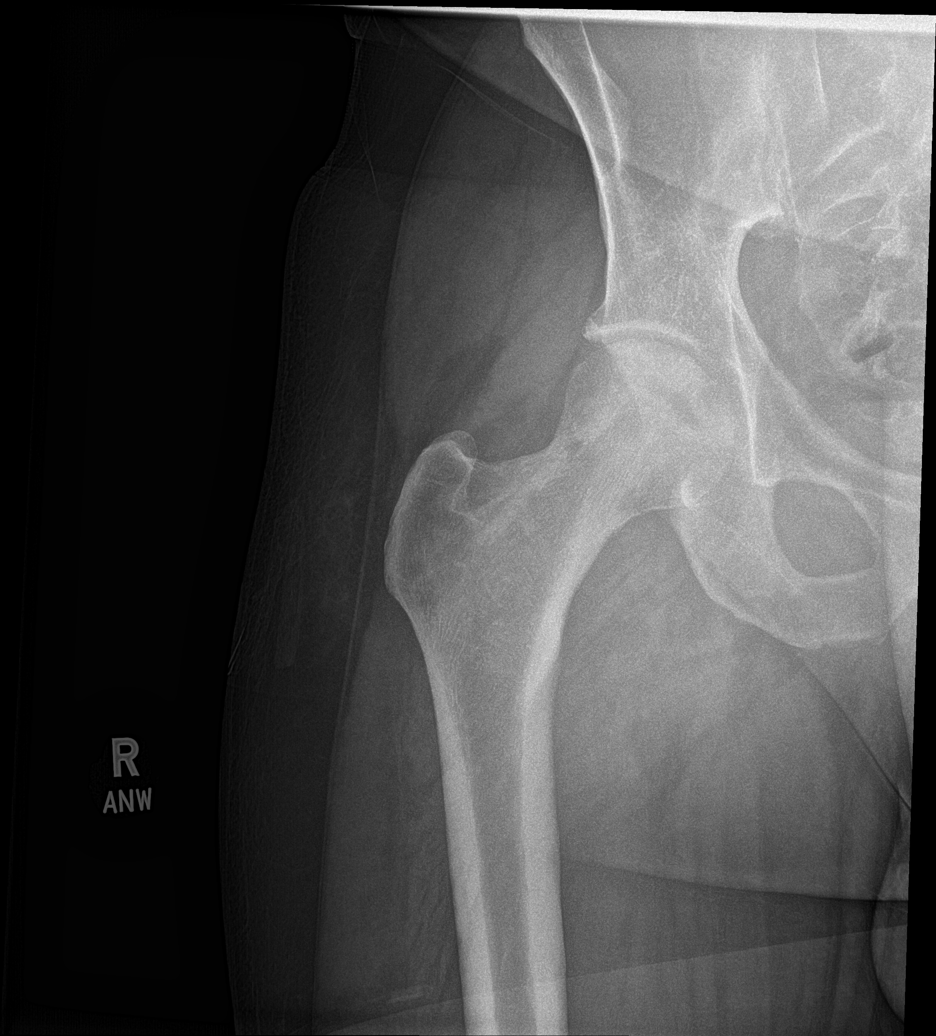

[hip frog leg]
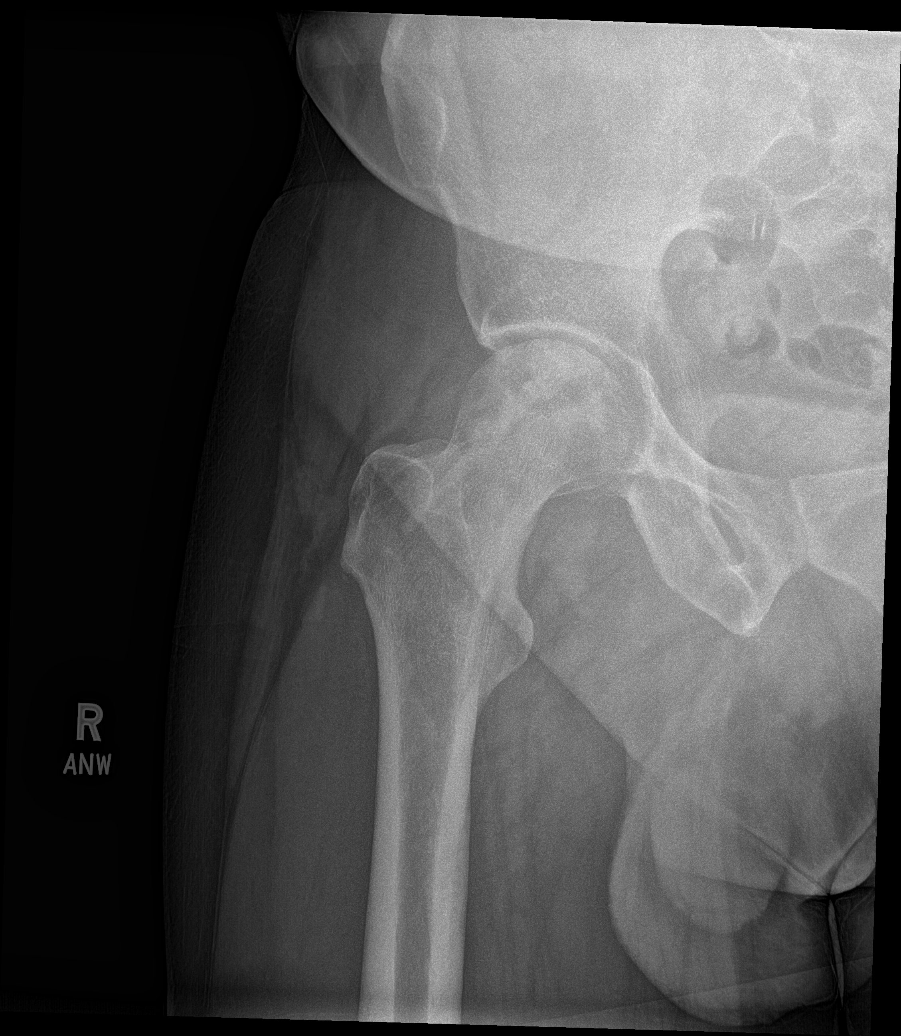

[hip axial]
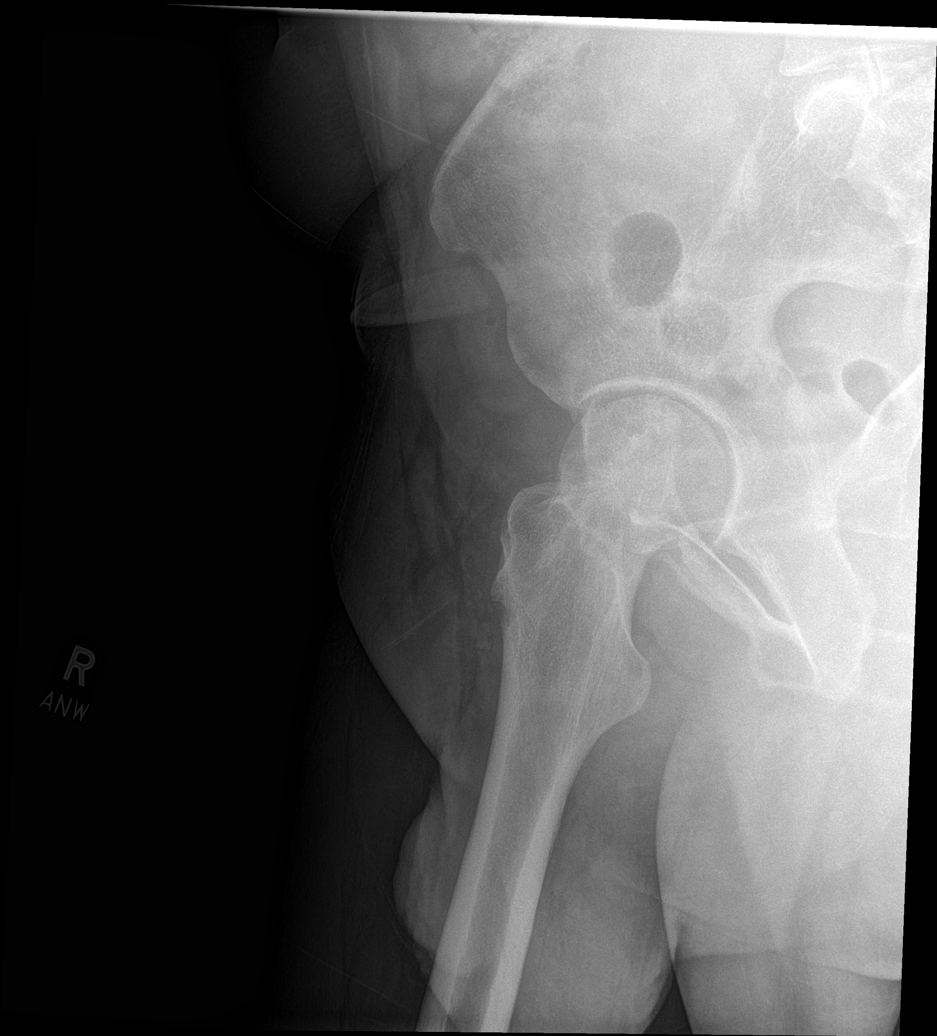

[4 of 4 positions shown; findings below may reference images not displayed]

FINDINGS: No acute fracture or dislocation is identified. Mixed sclerosis and
lucency in the right femoral head are similar to the prior study and
consistent with avascular necrosis. Linear sclerosis is again noted
traversing the femoral head and neck consistent with the history of
core decompression.
IMPRESSION: Unchanged appearance of the right hip with findings of avascular
necrosis status post core decompression. No acute osseous
abnormality.

## 2022-01-10 NOTE — Progress Notes (Signed)
Post Operative Evaluation    Procedure/Date of Surgery: Right hip core decompression performed April 11,2023  Interval History:    Presents today status post the above procedure.  Overall he is doing extremely well.  He has been weightbearing as tolerated with very little pain.  He is now walking without any type of cane or assistive device.  He has been working on improving his external rotation and range of motion with physical therapy.   PMH/PSH/Family History/Social History/Meds/Allergies:    Past Medical History:  Diagnosis Date   Allergy    Anxiety    GERD (gastroesophageal reflux disease)    History of hiatal hernia    Hyperlipidemia    Past Surgical History:  Procedure Laterality Date   COLONOSCOPY  01/2017   DECOMPRESSION HIP-CORE Right 11/27/2021   Procedure: RIGHT DECOMPRESSION HIP - CORE WITH BONE MARROW ASPIRATION;  Surgeon: Vanetta Mulders, MD;  Location: [place];  Service: Orthopedics;  Laterality: Right;   ESOPHAGOGASTRODUODENOSCOPY  01/2017   hiatal hernia   NASAL SEPTUM SURGERY  2018   SEPTOPLASTY     & turbinate reduction   Social History   Socioeconomic History   Marital status: Married    Spouse name: Not on file   Number of children: Not on file   Years of education: Not on file   Highest education level: Not on file  Occupational History   Not on file  Tobacco Use   Smoking status: Never   Smokeless tobacco: Never  Vaping Use   Vaping Use: Never used  Substance and Sexual Activity   Alcohol use: Yes    Alcohol/week: 10.0 standard drinks    Types: 5 Cans of beer, 5 Shots of liquor per week   Drug use: Never   Sexual activity: Not on file  Other Topics Concern   Not on file  Social History Narrative   Married, wife in PhD music program [employer], he teaches music and band at [employer], exercise most days per week, Darrick Meigs, has son in school of arts.   01/2021   Social Determinants of Health    Financial Resource Strain: Not on file  Food Insecurity: Not on file  Transportation Needs: Not on file  Physical Activity: Not on file  Stress: Not on file  Social Connections: Not on file   Family History  Problem Relation Age of Onset   Healthy Mother    Diabetes Father    Healthy Brother    Colon polyps Brother    Healthy Brother    Arthritis Maternal Grandmother    Diabetes Paternal Grandmother    Lung cancer Paternal Grandmother    Diabetes Paternal Grandfather    Heart disease Paternal Grandfather    Allergies  Allergen Reactions   Grass Pollen(K-[id number]-Swt Vern)    Pollen Extract    Current Outpatient Medications  Medication Sig Dispense Refill   ALPRAZolam (XANAX) 0.5 MG tablet Take 1 tablet (0.5 mg total) by mouth at bedtime as needed for anxiety. 30 tablet 0   aspirin EC 325 MG tablet Take 1 tablet (325 mg total) by mouth daily. 30 tablet 0   atorvastatin (LIPITOR) 10 MG tablet Take 1 tablet (10 mg total) by mouth daily. 90 tablet 3   escitalopram (LEXAPRO) 10 MG tablet TAKE 1 TABLET BY MOUTH EVERY DAY  90 tablet 0   Fexofenadine HCl (ALLEGRA PO) Take 1 tablet by mouth daily.     Olopatadine HCl (PATADAY) 0.2 % SOLN Place 1 drop in each eye once a day as needed for itchy watery eyes 2.5 mL 5   omeprazole (PRILOSEC) 20 MG capsule TAKE 1 CAPSULE BY MOUTH EVERY DAY IN THE MORNING 90 capsule 1   oxyCODONE (OXY IR/ROXICODONE) 5 MG immediate release tablet Take 1 tablet (5 mg total) by mouth every 4 (four) hours as needed (severe pain). 20 tablet 0   Vitamin D, Ergocalciferol, (DRISDOL) 1.25 MG (50000 UNIT) CAPS capsule Take 1 capsule (50,000 Units total) by mouth every 7 (seven) days. (Patient taking differently: Take 50,000 Units by mouth every 7 (seven) days. Wednesday) 12 capsule 3   No current facility-administered medications for this visit.   No results found.  Review of Systems:   A ROS was performed including pertinent positives and negatives as documented in  the HPI.   Musculoskeletal Exam:    There were no vitals taken for this visit.  Right hip with no pain with 20 degrees of internal and external rotation.  He is able to flex of the right hip while sitting.  Walks with mildly antalgic gait with a crutch on the right hip.  He is able to extend at the right knee.  Previous iliac crest and lateral hip incisions are well-healing without erythema or drainage.  2+ dorsalis pedis pulse  Imaging:    X-ray right hip 3 views: Status post core decompression without evidence of complication.  There is  I personally reviewed and interpreted the radiographs.   Assessment:   46 year old male 6 weeks status post right core decompression doing extremely well.  He has been weightbearing now without any difficulty.  At this time I would like him to be activity as tolerated.  He will continue to work on physical therapy for improving his range of motion.  I will see him back for 1 final recheck prior to his departure to [place] for his wife's job in 14 month.  Plan :    -Return to clinic in 4 weeks for reassessment      I personally saw and evaluated the patient, and participated in the management and treatment plan.  Vanetta Mulders, MD Attending Physician, Orthopedic Surgery  This document was dictated using Dragon voice recognition software. A reasonable attempt at proof reading has been made to minimize errors.

## 2022-01-15 ENCOUNTER — Encounter (HOSPITAL_BASED_OUTPATIENT_CLINIC_OR_DEPARTMENT_OTHER): Payer: Self-pay | Admitting: Orthopaedic Surgery

## 2022-01-21 ENCOUNTER — Encounter (HOSPITAL_BASED_OUTPATIENT_CLINIC_OR_DEPARTMENT_OTHER): Payer: Self-pay | Admitting: Orthopaedic Surgery

## 2022-01-23 ENCOUNTER — Ambulatory Visit (HOSPITAL_BASED_OUTPATIENT_CLINIC_OR_DEPARTMENT_OTHER): Payer: BC Managed Care – PPO | Attending: Orthopaedic Surgery | Admitting: Physical Therapy

## 2022-01-23 ENCOUNTER — Ambulatory Visit (INDEPENDENT_AMBULATORY_CARE_PROVIDER_SITE_OTHER): Payer: BC Managed Care – PPO

## 2022-01-23 DIAGNOSIS — R2689 Other abnormalities of gait and mobility: Secondary | ICD-10-CM | POA: Diagnosis present

## 2022-01-23 DIAGNOSIS — J309 Allergic rhinitis, unspecified: Secondary | ICD-10-CM | POA: Diagnosis not present

## 2022-01-23 DIAGNOSIS — M25552 Pain in left hip: Secondary | ICD-10-CM | POA: Diagnosis present

## 2022-01-23 DIAGNOSIS — M25652 Stiffness of left hip, not elsewhere classified: Secondary | ICD-10-CM | POA: Insufficient documentation

## 2022-01-23 NOTE — Therapy (Incomplete)
OUTPATIENT PHYSICAL THERAPY LOWER EXTREMITY EVALUATION   Patient Name: Cameron Joyce MRN: [id number] DOB:03-May-1976, 46 y.o., male Today's Date: 01/23/2022      Past Medical History:  Diagnosis Date   Allergy    Anxiety    GERD (gastroesophageal reflux disease)    History of hiatal hernia    Hyperlipidemia    Past Surgical History:  Procedure Laterality Date   COLONOSCOPY  01/2017   DECOMPRESSION HIP-CORE Right 11/27/2021   Procedure: RIGHT DECOMPRESSION HIP - CORE WITH BONE MARROW ASPIRATION;  Surgeon: [name], MD;  Location: [facility] OR;  Service: Orthopedics;  Laterality: Right;   ESOPHAGOGASTRODUODENOSCOPY  01/2017   hiatal hernia   NASAL SEPTUM SURGERY  2018   SEPTOPLASTY     & turbinate reduction   Patient Active Problem List   Diagnosis Date Noted   Avascular necrosis of right femur (HCC)    Pain in right hip 11/06/2021   Need for Tdap vaccination 02/08/2021   History of hip fracture 02/08/2021   Chronic right hip pain 07/03/2020   Encounter for health maintenance examination in adult 11/05/2019   Gastroesophageal reflux disease 11/05/2019   Hiatal hernia 11/05/2019   Allergic rhinitis due to pollen 11/05/2019   Hyperlipidemia 11/05/2019   Work stress 11/05/2019   Vaccine counseling 11/05/2019   Seasonal and perennial allergic rhinitis 11/01/2019   Allergic conjunctivitis 11/01/2019    PCP: Jac Canavan, PA-C  REFERRING PROVIDER: Jac Canavan, PA-C  REFERRING DIAG: Left Hip Avascular Necrosis and core decompression.   THERAPY DIAG:  No diagnosis found.  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 Soreness has been improving day-to-day since but is still mild at session today.  PERTINENT HISTORY: AVN of the right hip   PAIN:  Are you having pain?  Yes: NPRS scale: 1/10 just feels it from time to time 5/18  Pain location: anterior hip  Pain description: ache  Aggravating factors: when he tries to cross his legs  Relieving factors: not doing that   PRECAUTIONS: None  WEIGHT BEARING RESTRICTIONS Yes WBAT  FALLS:  Has patient fallen in last 6 months? No  LIVING ENVIRONMENT: Lives with: 2 steps in the front door   OCCUPATION: band director    PLOF: Independent  PATIENT GOALS  To have less pain    OBJECTIVE:   DIAGNOSTIC FINDINGS:   PATIENT SURVEYS:  FOTO    COGNITION:  Overall cognitive status: Within functional limits for tasks assessed     SENSATION: WFL    POSTURE:  good  PALPATION: No unexpted TTP   LE ROM:  Active ROM Right 01/23/2022 Left 01/23/2022  Hip flexion 90 with mild pain  Full   Hip extension    Hip abduction    Hip adduction    Hip internal rotation Mild pain but full range    Hip external rotation 30 degrees not pushed to end range    Knee flexion    Knee extension    Ankle dorsiflexion    Ankle plantarflexion    Ankle inversion    Ankle eversion     (Blank rows = not tested)  LE MMT:  MMT Right Eval Left Eval Right 6/7  Hip flexion 11.9 46.8 24  Hip extension     Hip abduction 36.0 42.1 44.3  Hip adduction  Hip internal rotation     Hip external rotation     Knee flexion     Knee extension 46.1 42.1   Ankle dorsiflexion     Ankle plantarflexion     Ankle inversion     Ankle eversion      (Blank rows = not tested) ( advised to give as much resistance as he could pain free )   GAIT: Walked without crutches with no major gait deviations   TODAY'S TREATMENT:  6/7 Warmup - exercise bike 5 min Manual -  STM of PROM flexion/IR/ER with grade I/II AP mobs   5/18 Ex bike 5 min: no pain   Bridge 2x15 SLR ( advised to montior anterior hip pain)  Supine march 2x15 SL SLR 2x15    Manual: PROM into all planes   Leg press 50 lbs 3x10 no pain noted.  Reviewed how to set it up for minimal pressure on the hip   Squat cable row 2x15 15 lbs  Squat cable ext 2x15 15 lbs   5/12 Bridge 2x15 SLR ( advised to montior anterior hip pain)  Supine march 2x10   Manual: PROM into all planes   Standing: lateral band walk 2x10 yellow.   Standing 3 way hip yellow 2x10 each leg      5/3  Reviewed self stretch into ER to put his socks on    Hip abduction red 2x15  Bridge 2x10  Supine march 2x10     PROM into flexion IR and ER   Heel raises 2x10   Step up 2 inch  Forward x15  Lateral x15  Step down x15   Nu-step 5 min   Hip 3way  3x10    Eval  Exercises - Supine Lower Trunk Rotation  - 2 x daily - 7 x weekly - 3 sets - 10 reps - Heel Raises with Counter Support  - 2 x daily - 7 x weekly - 3 sets - 10 reps - Hip and Knee Extension and Flexion Caregiver PROM  - 2 x daily - 7 x weekly - 3 sets - 5 reps - 10 sec  hold - Standing March with Counter Support  - 2 x daily - 7 x weekly - 2 sets - 10 reps   PATIENT EDUCATION:  Education details: reviewed weaning program for the crutches; reviewed HEP and symptom management  Person educated: Patient Education method: Explanation, Demonstration, Tactile cues, Verbal cues, and Handouts Education comprehension: verbalized understanding, returned demonstration, verbal cues required, tactile cues required, and needs further education   HOME EXERCISE PROGRAM: Access Code: [email or web address] URL: [email or web address] Date: 12/13/2021 Prepared by: Lorayne Bender  Exercises - Supine Lower Trunk Rotation  - 2 x daily - 7 x weekly - 3 sets - 10 reps - Heel Raises with Counter Support  - 2 x daily - 7 x weekly - 3 sets - 10 reps - Hip and Knee Extension and Flexion Caregiver PROM  - 2 x daily - 7 x weekly - 3 sets - 5 reps - 10 sec  hold - Standing March with Counter Support  - 2 x daily - 7 x weekly - 2 sets - 10 reps  ASSESSMENT:  CLINICAL IMPRESSION: Patient performed light  gym equipment exercise. When he moves he should have assess to the gym. We added in a side lying SLR to his HEP. He has band work as well. He continues to have some tightness with hip ER. He was shown a progressive stretch  for that movement. He will come back in for 1 follow up prior to moving to [place].  OBJECTIVE IMPAIRMENTS Abnormal gait, decreased activity tolerance, decreased endurance, difficulty walking, decreased ROM, decreased strength, impaired flexibility, and pain.   ACTIVITY LIMITATIONS cleaning, community activity, meal prep, occupation, and shopping.   PERSONAL FACTORS None    REHAB POTENTIAL: Excellent  CLINICAL DECISION MAKING: Stable/uncomplicated  EVALUATION COMPLEXITY: Low   GOALS: Goals reviewed with patient? Yes  SHORT TERM GOALS: Target date: 02/20/2022  Patient will demonstrate full active hip flexion  Baseline: Goal status: INITIAL  2.  Patient will ambulate 1/2 mile without the crutches with no pain  Baseline:  Goal status: INITIAL  3.  Patient will increase gross right LE strength by 5 lbs Baseline:  Goal status: INITIAL   LONG TERM GOALS: Target date: 03/20/2022  Patient will ambulate 2 miles for exercises without pain in order to exercises  Baseline:  Goal status: INITIAL  2.  Patient will stand for 1 hour without pain in order to perform ADL's  Baseline:  Goal status: INITIAL  3.  Patient will sit at work for 2 hours without pain  Baseline:  Goal status: INITIAL     PLAN: PT FREQUENCY: 1-2x/week  PT DURATION: 8 weeks  PLANNED INTERVENTIONS: Therapeutic exercises, Therapeutic activity, Neuromuscular re-education, Balance training, Gait training, Patient/Family education, Joint mobilization, DME instructions, Aquatic Therapy, Dry Needling, Electrical stimulation, Taping, Ultrasound, Ionotophoresis 4mg /ml Dexamethasone, and Manual therapy  PLAN FOR NEXT SESSION: continue with light ROM; continue with progressive weight bearing. Progress  to the pool if needed.    Loura HaltMatthew Roger Fasnacht, Student-PT 01/23/2022, 4:05 PM

## 2022-01-24 ENCOUNTER — Encounter (HOSPITAL_BASED_OUTPATIENT_CLINIC_OR_DEPARTMENT_OTHER): Payer: Self-pay | Admitting: Physical Therapy

## 2022-01-24 NOTE — Therapy (Unsigned)
OUTPATIENT PHYSICAL THERAPY LOWER EXTREMITY /Discharge    Patient Name: Cameron Joyce MRN: [id number] DOB:May 02, 1976, 46 y.o., male Today'Joyce Date: 01/24/2022   PT End of Session - 01/23/22 1606     Visit Number 5    Number of Visits 16    Date for PT Re-Evaluation 02/07/22    PT Start Time 1603    PT Stop Time 1641    PT Time Calculation (min) 38 min    Activity Tolerance Patient tolerated treatment well    Behavior During Therapy [facility] for tasks assessed/performed              Past Medical History:  Diagnosis Date   Allergy    Anxiety    GERD (gastroesophageal reflux disease)    History of hiatal hernia    Hyperlipidemia    Past Surgical History:  Procedure Laterality Date   COLONOSCOPY  01/2017   DECOMPRESSION HIP-CORE Right 11/27/2021   Procedure: RIGHT DECOMPRESSION HIP - CORE WITH BONE MARROW ASPIRATION;  Surgeon: Huel CoteBokshan, Steven, MD;  Location: [facility] OR;  Service: Orthopedics;  Laterality: Right;   ESOPHAGOGASTRODUODENOSCOPY  01/2017   hiatal hernia   NASAL SEPTUM SURGERY  2018   SEPTOPLASTY     & turbinate reduction   Patient Active Problem List   Diagnosis Date Noted   Avascular necrosis of right femur (HCC)    Pain in right hip 11/06/2021   Need for Tdap vaccination 02/08/2021   History of hip fracture 02/08/2021   Chronic right hip pain 07/03/2020   Encounter for health maintenance examination in adult 11/05/2019   Gastroesophageal reflux disease 11/05/2019   Hiatal hernia 11/05/2019   Allergic rhinitis due to pollen 11/05/2019   Hyperlipidemia 11/05/2019   Work stress 11/05/2019   Vaccine counseling 11/05/2019   Seasonal and perennial allergic rhinitis 11/01/2019   Allergic conjunctivitis 11/01/2019  PCP: Jac Canavanysinger, Cameron Clapper Joyce, Cameron Joyce   REFERRING PROVIDER: Jac Canavanysinger, Cameron Strollo Joyce, Cameron Joyce   REFERRING DIAG: Left Hip Avascular Necrosis and core decompression.    THERAPY DIAG:  No diagnosis found.   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 Soreness has been improving day-to-day since but is still mild at session today.   PERTINENT HISTORY: AVN of the right hip    PAIN:  Are you having pain? Yes: NPRS scale: 1/10 just feels it from time to time 5/18  Pain location: anterior hip  Pain description: ache  Aggravating factors: when he tries to cross his legs  Relieving factors: not doing that    PRECAUTIONS: None   WEIGHT BEARING RESTRICTIONS Yes WBAT   FALLS:  Has patient fallen in last 6 months? No   LIVING ENVIRONMENT: Lives with: 2 steps in the front door    OCCUPATION: band director      PLOF: Independent   PATIENT GOALS  To have less pain      OBJECTIVE:    DIAGNOSTIC FINDINGS:    PATIENT SURVEYS:  FOTO     COGNITION:           Overall cognitive status: Within functional limits for tasks assessed                          SENSATION: WFL       POSTURE:  good  PALPATION: No unexpted TTP    LE ROM:   Active ROM Right 01/23/2022 Left 01/23/2022  Hip flexion 90 with mild pain  Full   Hip extension      Hip abduction      Hip adduction      Hip internal rotation Mild pain but full range     Hip external rotation 30 degrees not pushed to end range     Knee flexion      Knee extension      Ankle dorsiflexion      Ankle plantarflexion      Ankle inversion      Ankle eversion       (Blank rows = not tested)   LE MMT:   MMT Right Eval Left Eval Right 6/7  Hip flexion 11.9 46.8 24  Hip extension        Hip abduction 36.0 42.1 44.3  Hip adduction        Hip internal rotation        Hip external rotation        Knee flexion        Knee extension 46.1 42.1    Ankle dorsiflexion        Ankle plantarflexion        Ankle inversion        Ankle eversion         (Blank rows = not tested) ( advised to give  as much resistance as he could pain free )     GAIT: Walked without crutches with no major gait deviations     TODAY'Joyce TREATMENT:   6/7 Warmup - exercise bike 5 min Manual -  STM of PROM flexion/IR/ER with grade II and III mobilizations posterior and inferior glides  Trigger point release to the PSIS and anterior hip   Reviewed self trigger point release;  Genle PROM into Hip IR and ER   Reviewed final HEP and plan going forward.      5/18 Ex bike 5 min: no pain    Bridge 2x15 SLR ( advised to montior anterior hip pain)  Supine march 2x15 SL SLR 2x15      Manual: PROM into all planes    Leg press 50 lbs 3x10 no pain noted. Reviewed how to set it up for minimal pressure on the hip    Squat cable row 2x15 15 lbs  Squat cable ext 2x15 15 lbs    5/12 Bridge 2x15 SLR ( advised to montior anterior hip pain)  Supine march 2x10    Manual: PROM into all planes    Standing: lateral band walk 2x10 yellow.    Standing 3 way hip yellow 2x10 each leg          5/3   Reviewed self stretch into ER to put his socks on      Hip abduction red 2x15  Bridge 2x10  Supine march 2x10        PROM into flexion IR and ER    Heel raises 2x10    Step up 2 inch  Forward x15  Lateral x15  Step down x15    Nu-step 5 min    Hip 3way  3x10      Eval  Exercises - Supine Lower Trunk Rotation  - 2 x daily - 7 x weekly - 3 sets - 10 reps - Heel Raises with Counter Support  - 2 x daily - 7 x weekly - 3 sets - 10  reps - Hip and Knee Extension and Flexion Caregiver PROM  - 2 x daily - 7 x weekly - 3 sets - 5 reps - 10 sec  hold - Standing March with Counter Support  - 2 x daily - 7 x weekly - 2 sets - 10 reps     PATIENT EDUCATION:  Education details: reviewed weaning program for the crutches; reviewed HEP and symptom management  Person educated: Patient Education method: Explanation, Demonstration, Tactile cues, Verbal cues, and Handouts Education comprehension:  verbalized understanding, returned demonstration, verbal cues required, tactile cues required, and needs further education     HOME EXERCISE PROGRAM: Access Code: [email or web address] URL: [email or web address] Date: 12/13/2021 Prepared by: Lorayne Bender   Exercises - Supine Lower Trunk Rotation  - 2 x daily - 7 x weekly - 3 sets - 10 reps - Heel Raises with Counter Support  - 2 x daily - 7 x weekly - 3 sets - 10 reps - Hip and Knee Extension and Flexion Caregiver PROM  - 2 x daily - 7 x weekly - 3 sets - 5 reps - 10 sec  hold - Standing March with Counter Support  - 2 x daily - 7 x weekly - 2 sets - 10 reps   ASSESSMENT:   CLINICAL IMPRESSION: The patients has made good progress overall. His strength numbers are progressing despite some baseline soreness. His hip er was more limited today since twisting it. He had a significant improvement in movement after manual therapy. He reported decreased pain. We also identified trigger points that he can work on at home. He has a large trigger point in his TFL and lumbar/ PSIS area. He would benefit from further therapy but will be moving to [place] then [place] in the near future. We will D/C him to HEP at this time.   OBJECTIVE IMPAIRMENTS Abnormal gait, decreased activity tolerance, decreased endurance, difficulty walking, decreased ROM, decreased strength, impaired flexibility, and pain.    ACTIVITY LIMITATIONS cleaning, community activity, meal prep, occupation, and shopping.    PERSONAL FACTORS None      REHAB POTENTIAL: Excellent   CLINICAL DECISION MAKING: Stable/uncomplicated   EVALUATION COMPLEXITY: Low     GOALS: Goals reviewed with patient? Yes   SHORT TERM GOALS: Target date: 02/20/2022   Patient will demonstrate full active hip flexion  Baseline: Goal status: INITIAL   2.  Patient will ambulate 1/2 mile without the crutches with no pain  Baseline:  Goal status: INITIAL   3.  Patient will increase gross  right LE strength by 5 lbs Baseline:  Goal status: INITIAL     LONG TERM GOALS: Target date: 03/20/2022   Patient will ambulate 2 miles for exercises without pain in order to exercises  Baseline:  Goal status: INITIAL   2.  Patient will stand for 1 hour without pain in order to perform ADL'Joyce  Baseline:  Goal status: INITIAL   3.  Patient will sit at work for 2 hours without pain  Baseline:  Goal status: INITIAL         PLAN: PT FREQUENCY: 1-2x/week   PT DURATION: 8 weeks   PLANNED INTERVENTIONS: Therapeutic exercises, Therapeutic activity, Neuromuscular re-education, Balance training, Gait training, Patient/Family education, Joint mobilization, DME instructions, Aquatic Therapy, Dry Needling, Electrical stimulation, Taping, Ultrasound, Ionotophoresis 4mg /ml Dexamethasone, and Manual therapy   PLAN FOR NEXT SESSION: continue with light ROM; continue with progressive weight bearing. Progress to the pool if needed.   PT DPT  Loura Halt, Student-PT 01/23/2022, 4:05 PM  During this treatment session, the therapist was present, participating in and directing the treatment.   Dessie Coma, PT 01/24/2022, 12:08 PM

## 2022-02-02 ENCOUNTER — Other Ambulatory Visit: Payer: Self-pay | Admitting: Medical

## 2022-02-04 NOTE — Telephone Encounter (Signed)
Request for a refill on Lexapro last apt was 02/08/21 next apt 02/12/22.

## 2022-02-12 ENCOUNTER — Ambulatory Visit (INDEPENDENT_AMBULATORY_CARE_PROVIDER_SITE_OTHER): Payer: BC Managed Care – PPO | Admitting: Orthopaedic Surgery

## 2022-02-12 ENCOUNTER — Encounter (HOSPITAL_BASED_OUTPATIENT_CLINIC_OR_DEPARTMENT_OTHER): Payer: Self-pay

## 2022-02-12 ENCOUNTER — Encounter: Payer: BC Managed Care – PPO | Admitting: Medical

## 2022-02-12 DIAGNOSIS — M87051 Idiopathic aseptic necrosis of right femur: Secondary | ICD-10-CM

## 2022-02-20 ENCOUNTER — Other Ambulatory Visit: Payer: Self-pay | Admitting: Medical

## 2022-04-02 ENCOUNTER — Other Ambulatory Visit: Payer: Self-pay | Admitting: Medical

## 2022-04-02 NOTE — Telephone Encounter (Signed)
Pt. Needs to call office for an apt.

## 2022-04-16 ENCOUNTER — Other Ambulatory Visit: Payer: Self-pay | Admitting: Medical

## 2022-05-05 ENCOUNTER — Other Ambulatory Visit: Payer: Self-pay | Admitting: Medical
